# Patient Record
Sex: Male | Born: 1970 | Race: White | Hispanic: No | Marital: Married | Smoking: Never smoker
Health system: Southern US, Community
[De-identification: ages and names within clinical notes are randomized; demographics above are authoritative.]

## PROBLEM LIST (undated history)

## (undated) DIAGNOSIS — K219 Gastro-esophageal reflux disease without esophagitis: Secondary | ICD-10-CM

## (undated) DIAGNOSIS — E785 Hyperlipidemia, unspecified: Secondary | ICD-10-CM

## (undated) DIAGNOSIS — G4730 Sleep apnea, unspecified: Secondary | ICD-10-CM

## (undated) DIAGNOSIS — F419 Anxiety disorder, unspecified: Secondary | ICD-10-CM

## (undated) DIAGNOSIS — N289 Disorder of kidney and ureter, unspecified: Secondary | ICD-10-CM

## (undated) HISTORY — DX: Hyperlipidemia, unspecified: E78.5

## (undated) HISTORY — PX: OTHER SURGICAL HISTORY: SHX169

## (undated) HISTORY — DX: Sleep apnea, unspecified: G47.30

---

## 2003-11-21 ENCOUNTER — Encounter: Admission: RE | Admit: 2003-11-21 | Discharge: 2003-11-21 | Payer: Self-pay | Admitting: Family Medicine

## 2004-11-12 HISTORY — PX: ESOPHAGOGASTRODUODENOSCOPY: SHX1529

## 2005-06-03 ENCOUNTER — Ambulatory Visit: Payer: Self-pay | Admitting: Family Medicine

## 2005-07-15 ENCOUNTER — Ambulatory Visit: Payer: Self-pay | Admitting: Family Medicine

## 2006-01-29 ENCOUNTER — Ambulatory Visit: Payer: Self-pay | Admitting: Family Medicine

## 2006-10-18 ENCOUNTER — Ambulatory Visit: Payer: Self-pay | Admitting: Family Medicine

## 2006-12-29 ENCOUNTER — Ambulatory Visit: Payer: Self-pay | Admitting: Family Medicine

## 2006-12-29 LAB — CONVERTED CEMR LAB
ALT: 28 units/L (ref 0–40)
BUN: 13 mg/dL (ref 6–23)
CO2: 32 meq/L (ref 19–32)
Glucose, Bld: 100 mg/dL — ABNORMAL HIGH (ref 70–99)
Phosphorus: 4.5 mg/dL (ref 2.3–4.6)
Potassium: 4.2 meq/L (ref 3.5–5.1)
Total CHOL/HDL Ratio: 4.8
Triglycerides: 185 mg/dL — ABNORMAL HIGH (ref 0–149)

## 2007-02-18 ENCOUNTER — Ambulatory Visit (HOSPITAL_BASED_OUTPATIENT_CLINIC_OR_DEPARTMENT_OTHER): Admission: RE | Admit: 2007-02-18 | Discharge: 2007-02-18 | Payer: Self-pay | Admitting: Orthopedic Surgery

## 2007-03-30 ENCOUNTER — Ambulatory Visit: Payer: Self-pay | Admitting: Family Medicine

## 2007-04-01 LAB — CONVERTED CEMR LAB
HDL: 46.9 mg/dL (ref >39.0)
LDL Cholesterol: 69 mg/dL (ref 0–99)
Triglycerides: 181 mg/dL — ABNORMAL HIGH (ref 0–149)
VLDL: 36 mg/dL (ref 0–40)

## 2007-08-09 ENCOUNTER — Emergency Department (HOSPITAL_COMMUNITY): Admission: EM | Admit: 2007-08-09 | Discharge: 2007-08-09 | Payer: Self-pay | Admitting: Emergency Medicine

## 2007-12-14 ENCOUNTER — Ambulatory Visit: Payer: Self-pay | Admitting: Family Medicine

## 2007-12-20 ENCOUNTER — Telehealth (INDEPENDENT_AMBULATORY_CARE_PROVIDER_SITE_OTHER): Payer: Self-pay | Admitting: *Deleted

## 2007-12-21 ENCOUNTER — Ambulatory Visit: Payer: Self-pay | Admitting: Family Medicine

## 2007-12-27 LAB — CONVERTED CEMR LAB
ALT: 23 units/L (ref 0–53)
CO2: 31 meq/L (ref 19–32)
Chloride: 105 meq/L (ref 96–112)
Direct LDL: 91 mg/dL
GFR calc Af Amer: 110 mL/min
GFR calc non Af Amer: 91 mL/min
Sodium: 142 meq/L (ref 135–145)
Total CHOL/HDL Ratio: 4

## 2008-01-18 ENCOUNTER — Telehealth: Payer: Self-pay | Admitting: Family Medicine

## 2008-01-23 ENCOUNTER — Ambulatory Visit: Payer: Self-pay | Admitting: Family Medicine

## 2008-01-23 DIAGNOSIS — E785 Hyperlipidemia, unspecified: Secondary | ICD-10-CM | POA: Insufficient documentation

## 2008-01-23 DIAGNOSIS — G5600 Carpal tunnel syndrome, unspecified upper limb: Secondary | ICD-10-CM | POA: Insufficient documentation

## 2008-01-23 DIAGNOSIS — F411 Generalized anxiety disorder: Secondary | ICD-10-CM | POA: Insufficient documentation

## 2008-01-23 DIAGNOSIS — J309 Allergic rhinitis, unspecified: Secondary | ICD-10-CM | POA: Insufficient documentation

## 2008-01-23 DIAGNOSIS — K219 Gastro-esophageal reflux disease without esophagitis: Secondary | ICD-10-CM

## 2008-01-23 DIAGNOSIS — G4700 Insomnia, unspecified: Secondary | ICD-10-CM | POA: Insufficient documentation

## 2008-05-25 ENCOUNTER — Ambulatory Visit: Payer: Self-pay | Admitting: Family Medicine

## 2008-05-29 LAB — CONVERTED CEMR LAB
ALT: 36 U/L
AST: 23 U/L
Cholesterol: 193 mg/dL
HDL: 40.8 mg/dL
LDL Cholesterol: 115 mg/dL — ABNORMAL HIGH
Total CHOL/HDL Ratio: 4.7
Triglycerides: 184 mg/dL — ABNORMAL HIGH
VLDL: 37 mg/dL

## 2008-06-01 ENCOUNTER — Ambulatory Visit: Payer: Self-pay | Admitting: Family Medicine

## 2008-08-20 ENCOUNTER — Telehealth: Payer: Self-pay | Admitting: Family Medicine

## 2008-10-12 ENCOUNTER — Ambulatory Visit: Payer: Self-pay | Admitting: Family Medicine

## 2008-10-12 DIAGNOSIS — R0989 Other specified symptoms and signs involving the circulatory and respiratory systems: Secondary | ICD-10-CM

## 2008-10-12 DIAGNOSIS — R0609 Other forms of dyspnea: Secondary | ICD-10-CM | POA: Insufficient documentation

## 2008-10-12 DIAGNOSIS — R404 Transient alteration of awareness: Secondary | ICD-10-CM

## 2008-10-26 ENCOUNTER — Ambulatory Visit: Payer: Self-pay | Admitting: Pulmonary Disease

## 2008-10-26 DIAGNOSIS — G4733 Obstructive sleep apnea (adult) (pediatric): Secondary | ICD-10-CM

## 2008-11-12 ENCOUNTER — Encounter: Payer: Self-pay | Admitting: Pulmonary Disease

## 2008-11-12 ENCOUNTER — Ambulatory Visit (HOSPITAL_BASED_OUTPATIENT_CLINIC_OR_DEPARTMENT_OTHER): Admission: RE | Admit: 2008-11-12 | Discharge: 2008-11-12 | Payer: Self-pay | Admitting: Pulmonary Disease

## 2008-11-20 ENCOUNTER — Encounter (INDEPENDENT_AMBULATORY_CARE_PROVIDER_SITE_OTHER): Payer: Self-pay | Admitting: *Deleted

## 2008-11-24 ENCOUNTER — Ambulatory Visit: Payer: Self-pay | Admitting: Pulmonary Disease

## 2008-11-26 ENCOUNTER — Telehealth (INDEPENDENT_AMBULATORY_CARE_PROVIDER_SITE_OTHER): Payer: Self-pay | Admitting: *Deleted

## 2008-11-28 ENCOUNTER — Ambulatory Visit: Payer: Self-pay | Admitting: Family Medicine

## 2008-11-29 LAB — CONVERTED CEMR LAB
ALT: 39 units/L (ref 0–53)
Glucose, Bld: 93 mg/dL (ref 70–99)
HDL: 38.6 mg/dL — ABNORMAL LOW (ref >39.00)
Phosphorus: 4.8 mg/dL — ABNORMAL HIGH (ref 2.3–4.6)
Sodium: 141 meq/L (ref 135–145)
Triglycerides: 297 mg/dL — ABNORMAL HIGH (ref 0.0–149.0)

## 2008-12-05 ENCOUNTER — Ambulatory Visit: Payer: Self-pay | Admitting: Family Medicine

## 2008-12-11 ENCOUNTER — Ambulatory Visit: Payer: Self-pay | Admitting: Pulmonary Disease

## 2008-12-20 ENCOUNTER — Telehealth (INDEPENDENT_AMBULATORY_CARE_PROVIDER_SITE_OTHER): Payer: Self-pay | Admitting: *Deleted

## 2009-01-07 ENCOUNTER — Ambulatory Visit: Payer: Self-pay | Admitting: Pulmonary Disease

## 2009-01-08 ENCOUNTER — Telehealth: Payer: Self-pay | Admitting: Family Medicine

## 2009-01-22 ENCOUNTER — Telehealth: Payer: Self-pay | Admitting: Family Medicine

## 2009-02-19 ENCOUNTER — Encounter: Payer: Self-pay | Admitting: Pulmonary Disease

## 2009-03-06 ENCOUNTER — Ambulatory Visit: Payer: Self-pay | Admitting: Family Medicine

## 2009-03-08 ENCOUNTER — Encounter: Payer: Self-pay | Admitting: Pulmonary Disease

## 2009-03-08 ENCOUNTER — Ambulatory Visit: Payer: Self-pay | Admitting: Family Medicine

## 2009-03-11 LAB — CONVERTED CEMR LAB
AST: 30 units/L (ref 0–37)
Cholesterol: 190 mg/dL (ref 0–200)
Direct LDL: 107 mg/dL
Total CHOL/HDL Ratio: 5

## 2009-05-10 ENCOUNTER — Ambulatory Visit: Payer: Self-pay | Admitting: Family Medicine

## 2009-05-10 DIAGNOSIS — M549 Dorsalgia, unspecified: Secondary | ICD-10-CM | POA: Insufficient documentation

## 2009-06-05 ENCOUNTER — Ambulatory Visit: Payer: Self-pay | Admitting: Family Medicine

## 2009-06-13 ENCOUNTER — Ambulatory Visit: Payer: Self-pay | Admitting: Professional

## 2009-07-09 ENCOUNTER — Ambulatory Visit: Payer: Self-pay | Admitting: Family Medicine

## 2009-08-01 ENCOUNTER — Telehealth: Payer: Self-pay | Admitting: Family Medicine

## 2009-09-10 ENCOUNTER — Telehealth: Payer: Self-pay | Admitting: Family Medicine

## 2009-10-01 ENCOUNTER — Ambulatory Visit: Payer: Self-pay | Admitting: Family Medicine

## 2009-10-09 ENCOUNTER — Telehealth (INDEPENDENT_AMBULATORY_CARE_PROVIDER_SITE_OTHER): Payer: Self-pay | Admitting: *Deleted

## 2010-10-14 NOTE — Progress Notes (Signed)
Summary: Record request  Request for records received from [facility]. Request forwarded to [facility]. Ryan Welch  October 09, 2009 3:08 PM

## 2011-01-27 NOTE — Op Note (Signed)
NAMEARYON, NHAM              ACCOUNT NO.:  [id number]   MEDICAL RECORD NO.:  [id number]          PATIENT TYPE:  AMB   LOCATION:  DSC                          FACILITY:  [facility]   PHYSICIAN:  Cindee Salt, M.D.       DATE OF BIRTH:  03/31/1971   DATE OF PROCEDURE:  02/18/2007  DATE OF DISCHARGE:                               OPERATIVE REPORT   PREOPERATIVE DIAGNOSIS:  Carpal tunnel syndrome left hand.   POSTOPERATIVE DIAGNOSIS:  Carpal tunnel syndrome left hand.   OPERATION:  Decompression left median nerve.   SURGEON:  Cindee Salt, M.D.   ASSISTANT:  Carolyne Fiscal R.N.   ANESTHESIA:  Forearm based IV regional.   HISTORY:  The patient is a 40 year old male with a history of carpal  tunnel syndrome, EMG nerve conductions positive.  This has not responded  to conservative treatment.  He has elected to undergo decompression.  He  is aware of risks and complications including infection, recurrence,  injury to arteries, nerves, tendons, incomplete relief of symptoms,  dystrophy. Questions have been encouraged and answered. In the  preoperative area he is seen, questions again encouraged and answered,  the extremity marked by both the patient and surgeon.  Antibiotic given.   PROCEDURE:  The patient is brought to the operating room where a forearm  based IV regional anesthetic was carried out without difficulty.  He was  prepped using DuraPrep, supine position, left arm free.  A longitudinal  incision was made in the palm and carried down through subcutaneous  tissue.  Bleeders were electrocauterized.  The palmar fascia was split,  superficial palmar arch identified, flexor tendon to the ring and little  finger identified. To the ulnar side of median nerve, the carpal  retinaculum was incised with sharp dissection, right angle and Sewall  retractor were placed between the skin and forearm fascia.  The fascia  released for approximately 1.5 cm proximal to the wrist crease under  direct  vision. The canal was explored.  Tenosynovium tissue was  moderately thickened, the area of deformity to the nerve was apparent.  No further lesions were identified.  The wound was irrigated.  The skin  was then closed interrupted 5-0 Vicryl Rapide sutures.  A sterile  compressive dressing and splint was applied.  The patient tolerated the  procedure well and was taken to the recovery room for observation in  satisfactory condition.  He is discharged home to return to the [facility] in one week on Vicodin.           ______________________________  Cindee Salt, M.D.     GK/MEDQ  D:  02/18/2007  T:  02/18/2007  Job:  [id number]

## 2011-01-27 NOTE — Procedures (Signed)
Ryan Welch, NEUMAN              ACCOUNT NO.:  [id number]   MEDICAL RECORD NO.:  [id number]          PATIENT TYPE:  OUT   LOCATION:  SLEEP CENTER                 FACILITY:  [facility]   PHYSICIAN:  Barbaraann Share, MD,FCCPDATE OF BIRTH:  02/03/73   DATE OF STUDY:  11/12/2008                            NOCTURNAL POLYSOMNOGRAM   REFERRING PHYSICIAN:  Barbaraann Share, MD,FCCP   INDICATION FOR THE STUDY:  Hypersomnia with sleep apnea.   EPWORTH SCORE:  17.   SLEEP ARCHITECTURE:  The patient had a total sleep time of 309 minutes,  with very little slow wave sleep and never achieved REM.  Sleep onset  latency was mildly prolonged at 31 minutes, and sleep efficiency was  moderately reduced at 80%.   RESPIRATORY DATA:  The patient was found to have 19 apneas and 48  hypopneas, giving him an apnea/hypopnea index of 13 events per hour.  The events were not positional and there was moderate snoring noted  throughout.   OXYGEN DATA:  There was O2 desaturation as low as 86% with his  obstructive events.   CARDIAC DATA:  Rare PAC, but no clinically significant arrhythmias were  seen.   MOVEMENT/PARASOMNIA:  The patient had no significant leg jerks or  abnormal behavior seen.   IMPRESSION/RECOMMENDATIONS:  1. Mild obstructive sleep apnea/hypopnea syndrome with an AHI of 13      events per hour, and moderate snoring noted throughout.  There was      O2 desaturation as low as 86% transiently with his events.      Treatment for this degree of sleep apnea can include a trial of      weight loss alone if applicable, upper airway surgery, dental      appliance, and also continuous positive airway pressure.  Clinical      correlation is suggested.  2. Rare premature atrial contraction, but no clinically significant      arrhythmias seen.      Barbaraann Share, MD,FCCP  Diplomate, American Board of Sleep  Medicine  Electronically Signed    [facility]/MEDQ  D:  11/24/2008 14:22:12  T:   11/25/2008 01:59:18  Job:  [id number]

## 2011-07-02 LAB — POCT HEMOGLOBIN-HEMACUE: Operator id: 128471

## 2011-10-16 ENCOUNTER — Other Ambulatory Visit: Payer: Self-pay | Admitting: Orthopedic Surgery

## 2011-10-19 ENCOUNTER — Encounter (HOSPITAL_BASED_OUTPATIENT_CLINIC_OR_DEPARTMENT_OTHER): Payer: Self-pay | Admitting: *Deleted

## 2011-10-19 NOTE — Progress Notes (Signed)
Requested sleep study from [facility] Tomi Bamberger) -- pt states he does not have sleep apnea but does  have a CPAP machine that he does not use.

## 2011-10-21 ENCOUNTER — Encounter (HOSPITAL_BASED_OUTPATIENT_CLINIC_OR_DEPARTMENT_OTHER): Payer: Self-pay | Admitting: Anesthesiology

## 2011-10-21 ENCOUNTER — Ambulatory Visit (HOSPITAL_BASED_OUTPATIENT_CLINIC_OR_DEPARTMENT_OTHER)
Admission: RE | Admit: 2011-10-21 | Discharge: 2011-10-21 | Disposition: A | Discharge status: Home / Self Care | Payer: 59 | Source: Ambulatory Visit | Attending: Orthopedic Surgery | Admitting: Orthopedic Surgery

## 2011-10-21 ENCOUNTER — Encounter (HOSPITAL_BASED_OUTPATIENT_CLINIC_OR_DEPARTMENT_OTHER): Payer: Self-pay | Admitting: *Deleted

## 2011-10-21 ENCOUNTER — Encounter (HOSPITAL_BASED_OUTPATIENT_CLINIC_OR_DEPARTMENT_OTHER)
Admission: RE | Disposition: A | Discharge status: Home / Self Care | Payer: Self-pay | Source: Ambulatory Visit | Attending: Orthopedic Surgery

## 2011-10-21 ENCOUNTER — Ambulatory Visit (HOSPITAL_BASED_OUTPATIENT_CLINIC_OR_DEPARTMENT_OTHER): Payer: 59 | Admitting: Anesthesiology

## 2011-10-21 DIAGNOSIS — G5600 Carpal tunnel syndrome, unspecified upper limb: Secondary | ICD-10-CM | POA: Insufficient documentation

## 2011-10-21 DIAGNOSIS — K219 Gastro-esophageal reflux disease without esophagitis: Secondary | ICD-10-CM | POA: Insufficient documentation

## 2011-10-21 DIAGNOSIS — Z8249 Family history of ischemic heart disease and other diseases of the circulatory system: Secondary | ICD-10-CM | POA: Insufficient documentation

## 2011-10-21 HISTORY — DX: Anxiety disorder, unspecified: F41.9

## 2011-10-21 HISTORY — DX: Gastro-esophageal reflux disease without esophagitis: K21.9

## 2011-10-21 HISTORY — PX: CARPAL TUNNEL RELEASE: SHX101

## 2011-10-21 LAB — POCT HEMOGLOBIN-HEMACUE: Hemoglobin: 15.4 g/dL (ref 13.0–17.0)

## 2011-10-21 SURGERY — CARPAL TUNNEL RELEASE
Anesthesia: Monitor Anesthesia Care | Site: Wrist | Laterality: Right | Wound class: Clean

## 2011-10-21 MED ORDER — BUPIVACAINE HCL (PF) 0.25 % IJ SOLN
INTRAMUSCULAR | Status: DC | PRN
  Administered 2011-10-21: 6 mL

## 2011-10-21 MED ORDER — MIDAZOLAM HCL 5 MG/5ML IJ SOLN
INTRAMUSCULAR | Status: DC | PRN
  Administered 2011-10-21: 2 mg via INTRAVENOUS

## 2011-10-21 MED ORDER — FENTANYL CITRATE 0.05 MG/ML IJ SOLN
INTRAMUSCULAR | Status: DC | PRN
  Administered 2011-10-21: 100 ug via INTRAVENOUS

## 2011-10-21 MED ORDER — PROPOFOL 10 MG/ML IV EMUL
INTRAVENOUS | Status: DC | PRN
  Administered 2011-10-21: 100 ug/kg/min via INTRAVENOUS

## 2011-10-21 MED ORDER — DEXAMETHASONE SODIUM PHOSPHATE 10 MG/ML IJ SOLN
INTRAMUSCULAR | Status: DC | PRN
Start: 2011-10-21 — End: 2011-10-21
  Administered 2011-10-21: 10 mg via INTRAVENOUS

## 2011-10-21 MED ORDER — LIDOCAINE HCL (CARDIAC) 20 MG/ML IV SOLN
INTRAVENOUS | Status: DC | PRN
  Administered 2011-10-21: 40 mg via INTRAVENOUS

## 2011-10-21 MED ORDER — ONDANSETRON HCL 4 MG/2ML IJ SOLN
INTRAMUSCULAR | Status: DC | PRN
  Administered 2011-10-21: 4 mg via INTRAVENOUS

## 2011-10-21 MED ORDER — CEFAZOLIN SODIUM-DEXTROSE 2-3 GM-% IV SOLR
2.0000 g | INTRAVENOUS | Status: AC
  Administered 2011-10-21: 2 g via INTRAVENOUS

## 2011-10-21 MED ORDER — CHLORHEXIDINE GLUCONATE 4 % EX LIQD: 60.0000 mL | Freq: Once | CUTANEOUS | Status: DC

## 2011-10-21 MED ORDER — LACTATED RINGERS IV SOLN
INTRAVENOUS | Status: DC
  Administered 2011-10-21: 09:00:00 via INTRAVENOUS

## 2011-10-21 MED ORDER — HYDROCODONE-ACETAMINOPHEN 5-500 MG PO TABS: 1.0000 | ORAL_TABLET | ORAL | Status: AC | PRN

## 2011-10-21 SURGICAL SUPPLY — 34 items
BANDAGE GAUZE ELAST BULKY 4 IN (GAUZE/BANDAGES/DRESSINGS) ×2 IMPLANT
BLADE SURG 15 STRL LF DISP TIS (BLADE) ×1 IMPLANT
BLADE SURG 15 STRL SS (BLADE) ×1
BNDG COHESIVE 3X5 TAN STRL LF (GAUZE/BANDAGES/DRESSINGS) ×2 IMPLANT
BNDG ESMARK 4X9 LF (GAUZE/BANDAGES/DRESSINGS) IMPLANT
CHLORAPREP W/TINT 26ML (MISCELLANEOUS) ×2 IMPLANT
CLOTH BEACON ORANGE TIMEOUT ST (SAFETY) ×2 IMPLANT
CORDS BIPOLAR (ELECTRODE) ×2 IMPLANT
COVER MAYO STAND STRL (DRAPES) ×2 IMPLANT
COVER TABLE BACK 60X90 (DRAPES) ×2 IMPLANT
CUFF TOURNIQUET SINGLE 18IN (TOURNIQUET CUFF) ×2 IMPLANT
DRAPE EXTREMITY T 121X128X90 (DRAPE) ×2 IMPLANT
DRAPE SURG 17X23 STRL (DRAPES) ×2 IMPLANT
DRSG KUZMA FLUFF (GAUZE/BANDAGES/DRESSINGS) ×2 IMPLANT
GAUZE XEROFORM 1X8 LF (GAUZE/BANDAGES/DRESSINGS) ×2 IMPLANT
GLOVE BIO SURGEON STRL SZ 6.5 (GLOVE) ×4 IMPLANT
GLOVE SURG ORTHO 8.0 STRL STRW (GLOVE) ×4 IMPLANT
GOWN BRE IMP PREV XXLGXLNG (GOWN DISPOSABLE) ×4 IMPLANT
GOWN PREVENTION PLUS XLARGE (GOWN DISPOSABLE) ×2 IMPLANT
NEEDLE 27GAX1X1/2 (NEEDLE) ×2 IMPLANT
NS IRRIG 1000ML POUR BTL (IV SOLUTION) ×2 IMPLANT
PACK BASIN DAY SURGERY FS (CUSTOM PROCEDURE TRAY) ×2 IMPLANT
PAD CAST 3X4 CTTN HI CHSV (CAST SUPPLIES) ×1 IMPLANT
PADDING CAST ABS 4INX4YD NS (CAST SUPPLIES) ×1
PADDING CAST ABS COTTON 4X4 ST (CAST SUPPLIES) ×1 IMPLANT
PADDING CAST COTTON 3X4 STRL (CAST SUPPLIES) ×1
SPONGE GAUZE 4X4 12PLY (GAUZE/BANDAGES/DRESSINGS) ×2 IMPLANT
STOCKINETTE 4X48 STRL (DRAPES) ×2 IMPLANT
SUT VICRYL 4-0 PS2 18IN ABS (SUTURE) IMPLANT
SUT VICRYL RAPIDE 4/0 PS 2 (SUTURE) ×2 IMPLANT
SYR BULB 3OZ (MISCELLANEOUS) ×2 IMPLANT
SYR CONTROL 10ML LL (SYRINGE) ×2 IMPLANT
TOWEL OR 17X24 6PK STRL BLUE (TOWEL DISPOSABLE) ×2 IMPLANT
UNDERPAD 30X30 INCONTINENT (UNDERPADS AND DIAPERS) ×2 IMPLANT

## 2011-10-21 NOTE — Anesthesia Preprocedure Evaluation (Signed)
Anesthesia Evaluation  Patient identified by MRN, date of birth, ID band Patient awake    Reviewed: Allergy & Precautions, H&P , NPO status , Patient's Chart, lab work & pertinent test results  Airway Mallampati: I TM Distance: >3 FB Neck ROM: Full    Dental No notable dental hx. (+) Teeth Intact   Pulmonary neg pulmonary ROS,  clear to auscultation  Pulmonary exam normal       Cardiovascular neg cardio ROS Regular Normal    Neuro/Psych  Neuromuscular disease Negative Neurological ROS  Negative Psych ROS   GI/Hepatic Neg liver ROS, GERD-  Medicated and Controlled,  Endo/Other  Negative Endocrine ROS  Renal/GU negative Renal ROS  Genitourinary negative   Musculoskeletal   Abdominal   Peds  Hematology negative hematology ROS (+)   Anesthesia Other Findings   Reproductive/Obstetrics negative OB ROS                           Anesthesia Physical Anesthesia Plan  ASA: II  Anesthesia Plan: MAC and Bier Block   Post-op Pain Management:    Induction: Intravenous  Airway Management Planned: Simple Face Mask  Additional Equipment:   Intra-op Plan:   Post-operative Plan:   Informed Consent: I have reviewed the patients History and Physical, chart, labs and discussed the procedure including the risks, benefits and alternatives for the proposed anesthesia with the patient or authorized representative who has indicated his/her understanding and acceptance.     Plan Discussed with: CRNA  Anesthesia Plan Comments:         Anesthesia Quick Evaluation

## 2011-10-21 NOTE — Op Note (Signed)
Dictated number: [id number]

## 2011-10-21 NOTE — Brief Op Note (Signed)
10/21/2011  9:55 AM  PATIENT:  Genevieve Norlander  41 y.o. male  PRE-OPERATIVE DIAGNOSIS:  right carpal tunnel syndrome  POST-OPERATIVE DIAGNOSIS:  same as preop  PROCEDURE:  Procedure(s): CARPAL TUNNEL RELEASE  SURGEON:  Surgeon(s): Nicki Reaper, MD  PHYSICIAN ASSISTANT:   ASSISTANTS: none   ANESTHESIA:   regional local  EBL:  Total I/O In: 500 [I.V.:500] Out: -   BLOOD ADMINISTERED:none  DRAINS: none   LOCAL MEDICATIONS USED:  MARCAINE 6CC  SPECIMEN:  No Specimen  DISPOSITION OF SPECIMEN:  N/A  COUNTS:  YES  TOURNIQUET:   Total Tourniquet Time Documented: Forearm (Right) - 18 minutes  DICTATION: .Other Dictation: Dictation Number [id number]  PLAN OF CARE: Discharge to home after PACU  PATIENT DISPOSITION:  PACU - hemodynamically stable.

## 2011-10-21 NOTE — Op Note (Signed)
[name], PARKE NO.:  [id number]  MEDICAL RECORD NO.:  [id number]  LOCATION:                                 FACILITY:  PHYSICIAN:  Cindee Salt, M.D.            DATE OF BIRTH:  DATE OF PROCEDURE:  10/21/2011 DATE OF DISCHARGE:                              OPERATIVE REPORT   PREOPERATIVE DIAGNOSE:  Carpal tunnel syndrome, right hand.  POSTOPERATIVE DIAGNOSIS:  Carpal tunnel syndrome, right hand.  OPERATION:  Decompression of right median nerve.  SURGEON:  Cindee Salt, M.D.  ANESTHESIA:  Forearm-based IV regional with local infiltration.  ANESTHESIOLOGIST:  Zenon Mayo, MD.  HISTORY:  The patient is a 41 year old male with a history of carpal tunnel syndrome, EMG nerve conductions positive.  This has not responded to conservative treatment.  He is desirous of proceeding to have this surgically released.  Pre, peri, postoperative course has been discussed along with risks and complications.  He is aware there is no guarantee with the surgery, possibility of infection, recurrence, injury to arteries, nerves, tendons, incomplete relief of symptoms, dystrophy.  In the preoperative area, the patient is seen, the extremity marked by both the patient and surgeon.  Antibiotic given.  PROCEDURE:  The patient was brought to the operating room where a forearm-based IV regional anesthetic was carried out without difficulty. He was prepped using ChloraPrep, supine position with the right arm free.  A 3 minutes dry time was allowed.  Time-out taken, confirming the patient and the procedure.  A longitudinal incision was made in the palm, carried down through subcutaneous tissue.  Bleeders were electrocauterized.  Palmar fascia was split.  Superficial palmar arch identified.  The flexor tendon of the ring and little finger identified to the ulnar side of the median nerve.  The carpal retinaculum was incised with sharp dissection.  Right angle and Sewall  retractor were placed between skin and forearm fascia.  The fascia released for approximately a centimeter and half proximal to the wrist crease under direct vision.  The canal was explored.  Air compression to the nerve was apparent.  No further lesions were identified.  The wound was irrigated.  Skin closed with interrupted 4-0 Vicryl Rapide sutures.  Local infiltration was given with 0.25% Marcaine without epinephrine 6 mL was used.  Sterile compressive dressing was applied with the fingers free.  On deflation of the tourniquet, all fingers immediately pinked.  He was taken to the recovery room for observation in satisfactory condition.          ______________________________ Cindee Salt, M.D.     GK/MEDQ  D:  10/21/2011  T:  10/21/2011  Job:  [id number]

## 2011-10-21 NOTE — Anesthesia Postprocedure Evaluation (Signed)
  Anesthesia Post-op Note  Patient: Ryan Welch  Procedure(s) Performed:  CARPAL TUNNEL RELEASE  Patient Location: PACU  Anesthesia Type: Bier block  Level of Consciousness: awake, alert  and oriented  Airway and Oxygen Therapy: Patient Spontanous Breathing and Patient connected to face mask oxygen  Post-op Pain: none  Post-op Assessment: Post-op Vital signs reviewed, Patient's Cardiovascular Status Stable, Respiratory Function Stable, Patent Airway and No signs of Nausea or vomiting  Post-op Vital Signs: Reviewed and stable  Complications: No apparent anesthesia complications

## 2011-10-21 NOTE — H&P (Signed)
Ryan Welch is complaining of pain, numbness and tingling of his right hand, median nerve distribution.  He states it is identical to his left hand prior to his carpal tunnel release.  He states the numbness and tingling is there on a constant basis, he has no new history of injury to the hand or to the neck.  He complains of a constant, mild to moderate burning type pain with a feeling of numbness to his fingers.  He states it is getting worse.  Activity, exercise and work make this worse. Rest makes it better.  He has been taking Aleve.  He has no history of diabetes, thyroid problems, arthritis or gout.  He is status post carpal tunnel release done five years ago on his left side.  Nerve conductions were positive at that point in time.    ALLERGIES:    None.  MEDICATIONS:    Crestor and Lexapro.  SURGICAL HISTORY:     Carpal tunnel release on his left hand, he states this is doing well.  FAMILY MEDICAL HISTORY:   Positive for heart disease otherwise negative.  SOCIAL HISTORY:     He does not smoke, he drinks socially.  He is married. 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  REVIEW OF SYSTEMS:     Positive for anxiety, otherwise negative 14 points.  Ryan Welch is an 41 y.o. male.   Chief Complaint: CTS rt HPI: see above  Past Medical History  Diagnosis Date  . Anxiety   . GERD (gastroesophageal reflux disease)     Past Surgical History  Procedure Date  . Deviated septum and polyps removed   . Left hand carpal tunnel surgery     History reviewed. No pertinent family history. Social History:  does not have a smoking history on file. He does not have any smokeless tobacco history on file. He reports that he does not drink alcohol or use illicit drugs.  Allergies:  Allergies  Allergen Reactions  . Atorvastatin     REACTION: myalgias  . Sertraline Hcl     REACTION: mood changes    Medications Prior to Admission  Medication Dose Route Frequency Provider Last Rate  Last Dose  . lactated ringers infusion   Intravenous Continuous Constance Goltz, MD 10 mL/hr at 10/21/11 0831     Medications Prior to Admission  Medication Sig Dispense Refill  . clonazePAM (KLONOPIN) 1 MG disintegrating tablet Take 1 mg by mouth as needed.       Marland Kitchen escitalopram (LEXAPRO) 20 MG tablet Take 20 mg by mouth daily.      Marland Kitchen ibuprofen (ADVIL,MOTRIN) 200 MG tablet Take 200 mg by mouth as needed.      Marland Kitchen omeprazole (PRILOSEC) 10 MG capsule Take 10 mg by mouth daily.      . rosuvastatin (CRESTOR) 5 MG tablet Take 5 mg by mouth daily.        Results for orders placed during the hospital encounter of 10/21/11 (from the past 48 hour(s))  POCT HEMOGLOBIN-HEMACUE     Status: Normal   Collection Time   10/21/11  8:34 AM      Component Value Range Comment   Hemoglobin 15.4  13.0 - 17.0 (g/dL)     No results found.   Pertinent items are noted in HPI.  Blood pressure 137/78, pulse 75, temperature 97.8 F (36.6 C), temperature source Oral, resp. rate 20, height 5\' 8"  (1.727 m), weight 88.451 kg (195 lb), SpO2 96.00%.  General  appearance: alert, cooperative and appears stated age Head: Normocephalic, without obvious abnormality Neck: no adenopathy Resp: clear to auscultation bilaterally Cardio: regular rate and rhythm, S1, S2 normal, no murmur, click, rub or gallop GI: soft, non-tender; bowel sounds normal; no masses,  no organomegaly Extremities: extremities normal, atraumatic, no cyanosis or edema Pulses: 2+ and symmetric Skin: Skin color, texture, turgor normal. No rashes or lesions Neurologic: Grossly normal Incision/Wound: na  Assessment/Plan He would like to go ahead and have this surgically released.  He is well aware of risks and complications including infection, recurrence, injury to arteries, nerves, tendons, incomplete relief of symptoms, dystrophy, having had his left side done.    Ryan Welch R 10/21/2011, 8:38 AM

## 2011-10-21 NOTE — Anesthesia Procedure Notes (Signed)
Procedure Name: MAC Date/Time: 10/21/2011 9:33 AM Performed by: Jearld Shines Pre-anesthesia Checklist: Patient identified, Emergency Drugs available, Suction available and Patient being monitored Patient Re-evaluated:Patient Re-evaluated prior to inductionOxygen Delivery Method: Simple face mask Preoxygenation: Pre-oxygenation with 100% oxygen Intubation Type: IV induction Placement Confirmation: positive ETCO2

## 2011-10-21 NOTE — Transfer of Care (Signed)
Immediate Anesthesia Transfer of Care Note  Patient: Ryan Welch  Procedure(s) Performed:  CARPAL TUNNEL RELEASE  Patient Location: PACU  Anesthesia Type: Bier block  Level of Consciousness: sedated  Airway & Oxygen Therapy: Patient Spontanous Breathing and Patient connected to face mask oxygen  Post-op Assessment: Report given to PACU RN and Post -op Vital signs reviewed and stable  Post vital signs: Reviewed and stable  Complications: No apparent anesthesia complications

## 2011-10-22 ENCOUNTER — Encounter (HOSPITAL_BASED_OUTPATIENT_CLINIC_OR_DEPARTMENT_OTHER): Payer: Self-pay | Admitting: Orthopedic Surgery

## 2011-10-30 ENCOUNTER — Telehealth: Payer: Self-pay | Admitting: Pulmonary Disease

## 2011-10-30 NOTE — Telephone Encounter (Signed)
I spoke with pt and he stated his wife had faxed [place] a form regarding his oral appliance. He stated he just spoke with someone from our office. I spoke with alida, libby, rhonda, and megan and they all stated they have not spoken with pt. I called pt back to advised him of this and he stated he will just have his wife to call on Monday bc she knows more about this. Will sign off message

## 2011-11-02 ENCOUNTER — Ambulatory Visit: Payer: 59 | Admitting: Pulmonary Disease

## 2014-01-08 ENCOUNTER — Emergency Department (HOSPITAL_COMMUNITY): Payer: 59

## 2014-01-08 ENCOUNTER — Encounter (HOSPITAL_COMMUNITY): Payer: Self-pay | Admitting: Emergency Medicine

## 2014-01-08 ENCOUNTER — Emergency Department (HOSPITAL_COMMUNITY)
Admission: EM | Admit: 2014-01-08 | Discharge: 2014-01-08 | Disposition: A | Discharge status: Home / Self Care | Payer: 59 | Attending: Emergency Medicine | Admitting: Emergency Medicine

## 2014-01-08 DIAGNOSIS — K219 Gastro-esophageal reflux disease without esophagitis: Secondary | ICD-10-CM | POA: Insufficient documentation

## 2014-01-08 DIAGNOSIS — N200 Calculus of kidney: Secondary | ICD-10-CM | POA: Insufficient documentation

## 2014-01-08 DIAGNOSIS — E785 Hyperlipidemia, unspecified: Secondary | ICD-10-CM | POA: Insufficient documentation

## 2014-01-08 DIAGNOSIS — Z79899 Other long term (current) drug therapy: Secondary | ICD-10-CM | POA: Insufficient documentation

## 2014-01-08 DIAGNOSIS — F411 Generalized anxiety disorder: Secondary | ICD-10-CM | POA: Insufficient documentation

## 2014-01-08 HISTORY — DX: Disorder of kidney and ureter, unspecified: N28.9

## 2014-01-08 LAB — CBC WITH DIFFERENTIAL/PLATELET
BASOS ABS: 0 10*3/uL (ref 0.0–0.1)
BASOS PCT: 0 % (ref 0–1)
EOS ABS: 0.1 10*3/uL (ref 0.0–0.7)
EOS PCT: 1 % (ref 0–5)
HCT: 44.2 % (ref 39.0–52.0)
Hemoglobin: 15.3 g/dL (ref 13.0–17.0)
Lymphocytes Relative: 34 % (ref 12–46)
Lymphs Abs: 2.2 10*3/uL (ref 0.7–4.0)
MCH: 28.7 pg (ref 26.0–34.0)
MCHC: 34.6 g/dL (ref 30.0–36.0)
MCV: 82.8 fL (ref 78.0–100.0)
Monocytes Absolute: 0.6 10*3/uL (ref 0.1–1.0)
Monocytes Relative: 9 % (ref 3–12)
NEUTROS PCT: 55 % (ref 43–77)
Neutro Abs: 3.6 10*3/uL (ref 1.7–7.7)
PLATELETS: 279 10*3/uL (ref 150–400)
RBC: 5.34 MIL/uL (ref 4.22–5.81)
RDW: 12.5 % (ref 11.5–15.5)
WBC: 6.4 10*3/uL (ref 4.0–10.5)

## 2014-01-08 LAB — COMPREHENSIVE METABOLIC PANEL
ALBUMIN: 4.5 g/dL (ref 3.5–5.2)
ALT: 26 U/L (ref 0–53)
AST: 26 U/L (ref 0–37)
Alkaline Phosphatase: 59 U/L (ref 39–117)
BUN: 17 mg/dL (ref 6–23)
CALCIUM: 9.7 mg/dL (ref 8.4–10.5)
CO2: 26 mEq/L (ref 19–32)
Chloride: 97 mEq/L (ref 96–112)
Creatinine, Ser: 1.13 mg/dL (ref 0.50–1.35)
GFR calc non Af Amer: 80 mL/min — ABNORMAL LOW (ref >90)
Glucose, Bld: 106 mg/dL — ABNORMAL HIGH (ref 70–99)
Potassium: 4.1 mEq/L (ref 3.7–5.3)
SODIUM: 139 meq/L (ref 137–147)
TOTAL PROTEIN: 7.6 g/dL (ref 6.0–8.3)
Total Bilirubin: 0.4 mg/dL (ref 0.3–1.2)

## 2014-01-08 LAB — URINALYSIS, ROUTINE W REFLEX MICROSCOPIC
Bilirubin Urine: NEGATIVE
Glucose, UA: NEGATIVE mg/dL
HGB URINE DIPSTICK: NEGATIVE
Ketones, ur: NEGATIVE mg/dL
Leukocytes, UA: NEGATIVE
Nitrite: NEGATIVE
PH: 5.5 (ref 5.0–8.0)
Protein, ur: NEGATIVE mg/dL
SPECIFIC GRAVITY, URINE: 1.029 (ref 1.005–1.030)
UROBILINOGEN UA: 0.2 mg/dL (ref 0.0–1.0)

## 2014-01-08 LAB — LIPASE, BLOOD: Lipase: 39 U/L (ref 11–59)

## 2014-01-08 MED ORDER — SODIUM CHLORIDE 0.9 % IV BOLUS (SEPSIS)
1000.0000 mL | Freq: Once | INTRAVENOUS | Status: AC
  Administered 2014-01-08: 1000 mL via INTRAVENOUS

## 2014-01-08 MED ORDER — MORPHINE SULFATE 4 MG/ML IJ SOLN
4.0000 mg | Freq: Once | INTRAMUSCULAR | Status: AC
Start: 2014-01-08 — End: 2014-01-08
  Administered 2014-01-08: 4 mg via INTRAVENOUS
  Filled 2014-01-08: qty 1

## 2014-01-08 MED ORDER — ONDANSETRON 4 MG PO TBDP: 4.0000 mg | ORAL_TABLET | Freq: Three times a day (TID) | ORAL | Status: AC | PRN

## 2014-01-08 MED ORDER — HYDROCODONE-ACETAMINOPHEN 5-325 MG PO TABS
2.0000 | ORAL_TABLET | ORAL | Status: AC | PRN
Start: 2014-01-08 — End: ?

## 2014-01-08 MED ORDER — IOHEXOL 300 MG/ML  SOLN
50.0000 mL | Freq: Once | INTRAMUSCULAR | Status: AC | PRN
  Administered 2014-01-08: 50 mL via ORAL

## 2014-01-08 MED ORDER — KETOROLAC TROMETHAMINE 30 MG/ML IJ SOLN
30.0000 mg | Freq: Once | INTRAMUSCULAR | Status: AC
  Administered 2014-01-08: 30 mg via INTRAVENOUS
  Filled 2014-01-08: qty 1

## 2014-01-08 MED ORDER — IOHEXOL 300 MG/ML  SOLN
100.0000 mL | Freq: Once | INTRAMUSCULAR | Status: AC | PRN
  Administered 2014-01-08: 100 mL via INTRAVENOUS

## 2014-01-08 NOTE — ED Notes (Signed)
MD at bedside. 

## 2014-01-08 NOTE — ED Provider Notes (Signed)
CSN: [id number]     Arrival date & time 01/08/14  [id number] History   First MD Initiated Contact with Patient 01/08/14 [phone]     Chief Complaint  Patient presents with  . Flank Pain     (Consider location/radiation/quality/duration/timing/severity/associated sxs/prior Treatment) HPI Comments: Patient is a 43 year old male with a past medical history of kidney stones who presents with abdominal pain that started this morning. The pain is located in his RLQ and does not radiate. The pain is described as sharp and severe. The pain started suddenly and progressively worsened since the onset. No alleviating/aggravating factors. The patient has tried nothing for symptoms without relief. Associated symptoms include nausea and urinary frequency. Patient denies fever, headache, vomiting, diarrhea, chest pain, SOB, dysuria, constipation.   Patient is a 43 y.o. male presenting with flank pain.  Flank Pain Associated symptoms include abdominal pain and nausea. Pertinent negatives include no arthralgias, chest pain, chills, fatigue, fever, neck pain, vomiting or weakness.    Past Medical History  Diagnosis Date  . Anxiety   . GERD (gastroesophageal reflux disease)   . Allergic rhinitis   . Hyperlipidemia   . Sleep apnea   . Renal disorder    Past Surgical History  Procedure Laterality Date  . Deviated septum and polyps removed    . Left hand carpal tunnel surgery    . Carpal tunnel release  10/21/2011    Procedure: CARPAL TUNNEL RELEASE;  Surgeon: Nicki ReaperGary R Kuzma, MD;  Location: [facility];  Service: Orthopedics;  Laterality: Right;  . Sleep study      2010  . Esophagogastroduodenoscopy  11/2004    gastritis   Family History  Problem Relation Age of Onset  . GER disease Father   . Coronary artery disease Father    History  Substance Use Topics  . Smoking status: Never Smoker   . Smokeless tobacco: Not on file  . Alcohol Use: No    Review of Systems  Constitutional: Negative  for fever, chills and fatigue.  HENT: Negative for trouble swallowing.   Eyes: Negative for visual disturbance.  Respiratory: Negative for shortness of breath.   Cardiovascular: Negative for chest pain and palpitations.  Gastrointestinal: Positive for nausea and abdominal pain. Negative for vomiting and diarrhea.  Genitourinary: Positive for flank pain. Negative for dysuria and difficulty urinating.  Musculoskeletal: Negative for arthralgias and neck pain.  Skin: Negative for color change.  Neurological: Negative for dizziness and weakness.  Psychiatric/Behavioral: Negative for dysphoric mood.      Allergies  Review of patient's allergies indicates no known allergies.  Home Medications   Prior to Admission medications   Medication Sig Start Date End Date Taking? Authorizing Provider  citalopram (CELEXA) 40 MG tablet Take 40 mg by mouth daily.   Yes Historical Provider, MD  clonazePAM (KLONOPIN) 0.25 MG disintegrating tablet Take 0.25 mg by mouth daily as needed (For sleep and aniexty).   Yes Historical Provider, MD  esomeprazole (NEXIUM) 40 MG capsule Take 40 mg by mouth daily at 12 noon.   Yes Historical Provider, MD  ibuprofen (ADVIL,MOTRIN) 200 MG tablet Take 600 mg by mouth every 6 (six) hours as needed (For pain).    Yes Historical Provider, MD  Omega-3 Fatty Acids (FISH OIL) 300 MG CAPS Take 300 mg by mouth daily.   Yes Historical Provider, MD  rosuvastatin (CRESTOR) 5 MG tablet Take 5 mg by mouth daily.   Yes Historical Provider, MD  traZODone (DESYREL) 50 MG tablet  Take 50 mg by mouth at bedtime.   Yes Historical Provider, MD   BP 149/88  Pulse 61  Temp(Src) 98.3 F (36.8 C) (Oral)  Resp 16  SpO2 100% Physical Exam  Nursing note and vitals reviewed. Constitutional: He is oriented to person, place, and time. He appears well-developed and well-nourished. No distress.  HENT:  Head: Normocephalic and atraumatic.  Eyes: Conjunctivae are normal.  Neck: Normal range of  motion.  Cardiovascular: Normal rate and regular rhythm.  Exam reveals no gallop and no friction rub.   No murmur heard. Pulmonary/Chest: Effort normal and breath sounds normal. He has no wheezes. He has no rales. He exhibits no tenderness.  Abdominal: Soft. He exhibits no distension. There is no tenderness. There is no rebound and no guarding.  Mild right lower quadrant abdominal tenderness to palpation. No peritoneal signs or other focal tenderness.   Genitourinary:  No CVA tenderness.   Musculoskeletal: Normal range of motion.  Neurological: He is alert and oriented to person, place, and time. Coordination normal.  Speech is goal-oriented. Moves limbs without ataxia.   Skin: Skin is warm and dry.  Psychiatric: He has a normal mood and affect. His behavior is normal.    ED Course  Procedures (including critical care time) Labs Review Labs Reviewed  COMPREHENSIVE METABOLIC PANEL - Abnormal; Notable for the following:    Glucose, Bld 106 (*)    GFR calc non Af Amer 80 (*)    All other components within normal limits  CBC WITH DIFFERENTIAL  LIPASE, BLOOD  URINALYSIS, ROUTINE W REFLEX MICROSCOPIC    Imaging Review Ct Abdomen Pelvis W Contrast  01/08/2014   CLINICAL DATA:  Right lower quadrant pain, flank pain.  EXAM: CT ABDOMEN AND PELVIS WITH CONTRAST  TECHNIQUE: Multidetector CT imaging of the abdomen and pelvis was performed using the standard protocol following bolus administration of intravenous contrast.  CONTRAST:  100mL OMNIPAQUE IOHEXOL 300 MG/ML  SOLN  COMPARISON:  None.  FINDINGS: Lung bases are clear.  No effusions.  Heart is normal size.  Diffuse fatty infiltration of the liver with diffuse low density. No focal abnormality or biliary ductal dilatation. Gallbladder, spleen, pancreas, adrenals and left kidney are unremarkable. There is mild right hydronephrosis and hydroureter with a punctate 2 mm distal right ureteral stone. Slight perinephric stranding on the right. Slight  delayed excretion of contrast from the right kidney.  Appendix is visualized and is normal. Bowel grossly unremarkable. No free fluid, free air, or adenopathy.  Urinary bladder and prostate unremarkable.  No acute bony abnormality or focal bone lesion.  IMPRESSION: 2 mm distal right ureteral stone with mild right hydronephrosis.  Fatty infiltration of the liver.   Electronically Signed   By: Charlett NoseKevin  Dover M.D.   On: 01/08/2014 11:29     EKG Interpretation None      MDM   Final diagnoses:  Right kidney stone    9:21 AM Labs and urinalysis pending. Vitals stable and patient afebrile. Patient will have pain medication.  11:52 AM Patient's CT abdomen pelvis shows 2mm right distal ureter stone. Patient's pain is controlled here. Patient urine shows no signs of infection. Due to the size of the stone, patient will be able to pass this at home. Patient will be discharged with pain and nausea medication. Vitals stable and patient afebrile.      Emilia BeckKaitlyn Noelia Lenart, PA-C 01/08/14 1157

## 2014-01-08 NOTE — ED Provider Notes (Signed)
Medical screening examination/treatment/procedure(s) were performed by non-physician practitioner and as supervising physician I was immediately available for consultation/collaboration.   EKG Interpretation None       Ryan RudeNathan R. Rubin PayorPickering, MD 01/08/14 [phone]

## 2014-01-08 NOTE — ED Notes (Signed)
Family at bedside. 

## 2014-01-08 NOTE — Discharge Instructions (Signed)
Take Vicodin as needed for pain. Take zofran as needed for nausea. Refer to attached documents for more information. Follow up with your doctor as needed.

## 2014-01-08 NOTE — ED Notes (Signed)
Per pt, states right flank pain for about a hour-last 2 days has had urinary frequency-history of kidney stones

## 2014-01-08 NOTE — ED Notes (Signed)
Patient transported to CT 

## 2015-05-28 ENCOUNTER — Ambulatory Visit
Admission: RE | Admit: 2015-05-28 | Discharge: 2015-05-28 | Disposition: A | Discharge status: Home / Self Care | Payer: Commercial Managed Care - HMO | Source: Ambulatory Visit | Attending: Physician Assistant | Admitting: Physician Assistant

## 2015-05-28 ENCOUNTER — Other Ambulatory Visit: Payer: Self-pay | Admitting: Physician Assistant

## 2015-05-28 DIAGNOSIS — M25532 Pain in left wrist: Principal | ICD-10-CM

## 2015-05-28 DIAGNOSIS — M25531 Pain in right wrist: Secondary | ICD-10-CM

## 2016-10-06 DIAGNOSIS — R079 Chest pain, unspecified: Secondary | ICD-10-CM | POA: Diagnosis not present

## 2016-10-06 DIAGNOSIS — E7800 Pure hypercholesterolemia, unspecified: Secondary | ICD-10-CM | POA: Diagnosis not present

## 2016-10-06 DIAGNOSIS — Z23 Encounter for immunization: Secondary | ICD-10-CM | POA: Diagnosis not present

## 2016-10-06 DIAGNOSIS — R0602 Shortness of breath: Secondary | ICD-10-CM | POA: Diagnosis not present

## 2016-10-08 ENCOUNTER — Encounter (INDEPENDENT_AMBULATORY_CARE_PROVIDER_SITE_OTHER): Payer: Commercial Managed Care - HMO

## 2016-10-08 DIAGNOSIS — R0602 Shortness of breath: Secondary | ICD-10-CM

## 2016-10-08 DIAGNOSIS — R079 Chest pain, unspecified: Secondary | ICD-10-CM | POA: Diagnosis not present

## 2016-10-08 LAB — EXERCISE TOLERANCE TEST
Estimated workload: 10.1 METS
Exercise duration (min): 8 min
Exercise duration (sec): 0 s
MPHR: 177 {beats}/min
Peak HR: 166 {beats}/min
Percent HR: 93 %
Percent of predicted max HR: 93 %
RPE: 17
Rest HR: 80 {beats}/min
Stage 1 DBP: 82 mmHg
Stage 1 Grade: 0 %
Stage 1 HR: 87 {beats}/min
Stage 1 SBP: 126 mmHg
Stage 1 Speed: 0 mph
Stage 2 Grade: 0 %
Stage 2 HR: 93 {beats}/min
Stage 2 Speed: 0 mph
Stage 3 Grade: 0 %
Stage 3 HR: 92 {beats}/min
Stage 3 Speed: 1 mph
Stage 4 Grade: 0.1 %
Stage 4 HR: 92 {beats}/min
Stage 4 Speed: 1 mph
Stage 5 DBP: 71 mmHg
Stage 5 Grade: 10 %
Stage 5 HR: 111 {beats}/min
Stage 5 SBP: 136 mmHg
Stage 5 Speed: 1.7 mph
Stage 6 DBP: 67 mmHg
Stage 6 Grade: 12 %
Stage 6 HR: 142 {beats}/min
Stage 6 SBP: 155 mmHg
Stage 6 Speed: 2.5 mph
Stage 7 Grade: 14 %
Stage 7 HR: 166 {beats}/min
Stage 7 Speed: 3.4 mph
Stage 8 DBP: 62 mmHg
Stage 8 Grade: 0 %
Stage 8 HR: 148 {beats}/min
Stage 8 SBP: 174 mmHg
Stage 8 Speed: 1.5 mph
Stage 9 DBP: 69 mmHg
Stage 9 Grade: 0 %
Stage 9 HR: 108 {beats}/min
Stage 9 SBP: 142 mmHg
Stage 9 Speed: 0 mph

## 2017-05-07 DIAGNOSIS — R51 Headache: Secondary | ICD-10-CM | POA: Diagnosis not present

## 2017-05-07 DIAGNOSIS — K219 Gastro-esophageal reflux disease without esophagitis: Secondary | ICD-10-CM | POA: Diagnosis not present

## 2017-05-07 DIAGNOSIS — E7800 Pure hypercholesterolemia, unspecified: Secondary | ICD-10-CM | POA: Diagnosis not present

## 2017-10-15 DIAGNOSIS — G479 Sleep disorder, unspecified: Secondary | ICD-10-CM | POA: Diagnosis not present

## 2017-10-15 DIAGNOSIS — R0683 Snoring: Secondary | ICD-10-CM | POA: Diagnosis not present

## 2018-05-18 DIAGNOSIS — K219 Gastro-esophageal reflux disease without esophagitis: Secondary | ICD-10-CM | POA: Diagnosis not present

## 2018-05-18 DIAGNOSIS — E7800 Pure hypercholesterolemia, unspecified: Secondary | ICD-10-CM | POA: Diagnosis not present

## 2018-08-02 ENCOUNTER — Ambulatory Visit: Payer: 59 | Admitting: Psychiatry

## 2018-08-02 DIAGNOSIS — G4700 Insomnia, unspecified: Secondary | ICD-10-CM | POA: Diagnosis not present

## 2018-08-02 DIAGNOSIS — F411 Generalized anxiety disorder: Secondary | ICD-10-CM | POA: Diagnosis not present

## 2018-08-02 MED ORDER — CITALOPRAM HYDROBROMIDE 10 MG PO TABS: ORAL_TABLET | ORAL | 3 refills | Status: DC

## 2018-08-02 NOTE — Progress Notes (Signed)
[facility]  Patient ID: Ryan NorlanderRandall C Welch,  MRN: [id number]  PCP: Milus Heightedmon, Noelle, PA-C  Date of Evaluation: 08/02/2018 Time spent:20 minutes  Chief Complaint:   HISTORY/CURRENT STATUS: HPI 47 year old white male last seen 06/07/2018.  Diagnoses include anxiety with panic attacks and insomnia.  He was doing well so we continue the same medications.  His medications are Celexa 20 mg a day Remeron 15  mg at bedtime.  Individual Medical History/ Review of Systems: Changes? :No   Allergies: Patient has no known allergies.  Current Medications:  Current Outpatient Medications:  .  esomeprazole (NEXIUM) 40 MG capsule, Take 40 mg by mouth daily at 12 noon., Disp: , Rfl:  .  ibuprofen (ADVIL,MOTRIN) 200 MG tablet, Take 600 mg by mouth every 6 (six) hours as needed (For pain). , Disp: , Rfl:  .  Omega-3 Fatty Acids (FISH OIL) 300 MG CAPS, Take 300 mg by mouth daily., Disp: , Rfl:  .  rosuvastatin (CRESTOR) 5 MG tablet, Take 5 mg by mouth daily., Disp: , Rfl:  .  citalopram (CELEXA) 10 MG tablet, 1 tab/day, Disp: 30 tablet, Rfl: 3 .  HYDROcodone-acetaminophen (NORCO/VICODIN) 5-325 MG per tablet, Take 2 tablets by mouth every 4 (four) hours as needed. (Patient not taking: Reported on 08/02/2018), Disp: 12 tablet, Rfl: 0 .  ondansetron (ZOFRAN ODT) 4 MG disintegrating tablet, Take 1 tablet (4 mg total) by mouth every 8 (eight) hours as needed for nausea or vomiting. (Patient not taking: Reported on 08/02/2018), Disp: 10 tablet, Rfl: 0 Medication Side Effects: none  Family Medical/ Social History: Changes? no  MENTAL HEALTH EXAM:  There were no vitals taken for this visit.There is no height or weight on file to calculate BMI.  General Appearance: Casual  Eye Contact:  Good  Speech:  Clear and Coherent  Volume:  Normal  Mood:  Euthymic  Affect:  Appropriate  Thought Process:  Goal Directed  Orientation:  Full (Time, Place, and Person)  Thought Content: Logical   Suicidal  Thoughts:  No  Homicidal Thoughts:  No  Memory:  WNL  Judgement:  Good  Insight:  Good  Psychomotor Activity:  Normal  Concentration:  Concentration: Good  Recall:  Good  Fund of Knowledge: Good  Language: Good  Assets:  Resilience  ADL's:  Intact  Cognition: WNL  Prognosis:  Good    DIAGNOSES:    ICD-10-CM   1. Anxiety state F41.1   2. Insomnia, unspecified type G47.00     Receiving Psychotherapy: No    RECOMMENDATIONS: No change return in 6 months.   Anne Fulay Meron Bocchino, PA-C

## 2018-11-01 ENCOUNTER — Ambulatory Visit: Payer: 59 | Admitting: Psychiatry

## 2018-11-01 DIAGNOSIS — F411 Generalized anxiety disorder: Secondary | ICD-10-CM | POA: Diagnosis not present

## 2018-11-01 MED ORDER — MIRTAZAPINE 15 MG PO TABS: 15.0000 mg | ORAL_TABLET | Freq: Every day | ORAL | 1 refills | Status: DC

## 2018-11-01 MED ORDER — CITALOPRAM HYDROBROMIDE 10 MG PO TABS: ORAL_TABLET | ORAL | 3 refills | Status: DC

## 2018-11-01 NOTE — Progress Notes (Signed)
[facility]  Patient ID: MERRILL SELDON,  MRN: [id number]  PCP: Milus Height, PA-C  Date of Evaluation: 11/01/2018 Time spent:20 minutes  Chief Complaint:   HISTORY/CURRENT STATUS: HPI patient last seen in November of last year.  He was doing well so we continued his current medications.   He continues to do well.   Individual Medical History/ Review of Systems: Changes? :No   Allergies: Patient has no known allergies.  Current Medications:  Current Outpatient Medications:  .  citalopram (CELEXA) 10 MG tablet, 1 tab/day, Disp: 30 tablet, Rfl: 3 .  esomeprazole (NEXIUM) 40 MG capsule, Take 40 mg by mouth daily at 12 noon., Disp: , Rfl:  .  HYDROcodone-acetaminophen (NORCO/VICODIN) 5-325 MG per tablet, Take 2 tablets by mouth every 4 (four) hours as needed. (Patient not taking: Reported on 08/02/2018), Disp: 12 tablet, Rfl: 0 .  ibuprofen (ADVIL,MOTRIN) 200 MG tablet, Take 600 mg by mouth every 6 (six) hours as needed (For pain). , Disp: , Rfl:  .  mirtazapine (REMERON) 15 MG tablet, Take 1 tablet (15 mg total) by mouth at bedtime., Disp: 90 tablet, Rfl: 1 .  Omega-3 Fatty Acids (FISH OIL) 300 MG CAPS, Take 300 mg by mouth daily., Disp: , Rfl:  .  ondansetron (ZOFRAN ODT) 4 MG disintegrating tablet, Take 1 tablet (4 mg total) by mouth every 8 (eight) hours as needed for nausea or vomiting. (Patient not taking: Reported on 08/02/2018), Disp: 10 tablet, Rfl: 0 .  rosuvastatin (CRESTOR) 5 MG tablet, Take 5 mg by mouth daily., Disp: , Rfl:  Medication Side Effects: none  Family Medical/ Social History: Changes? no  MENTAL HEALTH EXAM:  There were no vitals taken for this visit.There is no height or weight on file to calculate BMI.  General Appearance: Casual  Eye Contact:  Good  Speech:  Clear and Coherent  Volume:  Normal  Mood:  Euthymic  Affect:  Appropriate  Thought Process:  Linear  Orientation:  Full (Time, Place, and Person)  Thought Content: Logical    Suicidal Thoughts:  No  Homicidal Thoughts:  No  Memory:  WNL  Judgement:  Good  Insight:  Good  Psychomotor Activity:  Normal  Concentration:  Concentration: Good  Recall:  Good  Fund of Knowledge: Good  Language: Good  Assets:  Desire for Improvement  ADL's:  Intact  Cognition: WNL  Prognosis:  Good    DIAGNOSES:    ICD-10-CM   1. Anxiety state F41.1     Receiving Psychotherapy: No    RECOMMENDATIONS: Patient wishes to cut his Celexa in half.  He is not sure if he is on 10 mg a day or is on 20 mg a day and taking half.  If he currently is on 20 mg a day and taking 1 to cut that in half if he is currently only taking 10 mg cut that in half and take 5 mg.  Right now he will keep the Remeron 15 mg a day.    Anne Fu, PA-C

## 2018-11-18 DIAGNOSIS — E7800 Pure hypercholesterolemia, unspecified: Secondary | ICD-10-CM | POA: Diagnosis not present

## 2018-11-18 DIAGNOSIS — K219 Gastro-esophageal reflux disease without esophagitis: Secondary | ICD-10-CM | POA: Diagnosis not present

## 2018-12-06 ENCOUNTER — Telehealth: Payer: Self-pay | Admitting: Psychiatry

## 2018-12-06 NOTE — Telephone Encounter (Signed)
Okay to increase mirtazapine back to 30 mg nightly per patient request.

## 2018-12-06 NOTE — Telephone Encounter (Signed)
Patient Remeron was decreased to 15mg . He is requesting to go back on 30mg . Stated it was more effective helping with his sleeping. Please advise

## 2018-12-06 NOTE — Telephone Encounter (Signed)
Left voicemail with information, instructed to call back with pharmacy if needing prescription submitted with 30mg 

## 2018-12-14 ENCOUNTER — Other Ambulatory Visit: Payer: Self-pay

## 2018-12-14 ENCOUNTER — Telehealth: Payer: Self-pay

## 2018-12-14 MED ORDER — MIRTAZAPINE 30 MG PO TABS: 30.0000 mg | ORAL_TABLET | Freq: Every day | ORAL | 1 refills | Status: DC

## 2018-12-14 NOTE — Telephone Encounter (Signed)
rx submitted to pharmacy in [facility]

## 2018-12-14 NOTE — Telephone Encounter (Signed)
Pt needs refill on Remeron 30mg . Needs new prescription sent to [facility]. RX changed from 15mg  to 30mg . Would like 90day supply if possible.

## 2019-03-02 ENCOUNTER — Ambulatory Visit: Payer: 59 | Admitting: Psychiatry

## 2019-03-05 ENCOUNTER — Emergency Department (HOSPITAL_COMMUNITY)
Admission: EM | Admit: 2019-03-05 | Discharge: 2019-03-05 | Disposition: A | Discharge status: Home / Self Care | Payer: 59 | Attending: Emergency Medicine | Admitting: Emergency Medicine

## 2019-03-05 ENCOUNTER — Encounter (HOSPITAL_COMMUNITY): Payer: Self-pay | Admitting: Emergency Medicine

## 2019-03-05 ENCOUNTER — Emergency Department (HOSPITAL_COMMUNITY): Payer: 59

## 2019-03-05 ENCOUNTER — Other Ambulatory Visit: Payer: Self-pay

## 2019-03-05 DIAGNOSIS — Y999 Unspecified external cause status: Secondary | ICD-10-CM | POA: Diagnosis not present

## 2019-03-05 DIAGNOSIS — Y9289 Other specified places as the place of occurrence of the external cause: Secondary | ICD-10-CM | POA: Diagnosis not present

## 2019-03-05 DIAGNOSIS — Y9389 Activity, other specified: Secondary | ICD-10-CM | POA: Insufficient documentation

## 2019-03-05 DIAGNOSIS — S6992XA Unspecified injury of left wrist, hand and finger(s), initial encounter: Secondary | ICD-10-CM | POA: Diagnosis present

## 2019-03-05 DIAGNOSIS — S61213A Laceration without foreign body of left middle finger without damage to nail, initial encounter: Secondary | ICD-10-CM | POA: Diagnosis not present

## 2019-03-05 DIAGNOSIS — Z79899 Other long term (current) drug therapy: Secondary | ICD-10-CM | POA: Diagnosis not present

## 2019-03-05 DIAGNOSIS — W268XXA Contact with other sharp object(s), not elsewhere classified, initial encounter: Secondary | ICD-10-CM | POA: Diagnosis not present

## 2019-03-05 MED ORDER — TETANUS-DIPHTH-ACELL PERTUSSIS 5-2.5-18.5 LF-MCG/0.5 IM SUSP
0.5000 mL | Freq: Once | INTRAMUSCULAR | Status: AC
  Administered 2019-03-05: 0.5 mL via INTRAMUSCULAR
  Filled 2019-03-05: qty 0.5

## 2019-03-05 MED ORDER — LIDOCAINE HCL 2 % IJ SOLN
10.0000 mL | Freq: Once | INTRAMUSCULAR | Status: AC
  Administered 2019-03-05: 200 mg
  Filled 2019-03-05: qty 20

## 2019-03-05 NOTE — ED Provider Notes (Signed)
[facility] DEPT Provider Note   CSN: [id number] Arrival date & time: 03/05/19  1507    History   Chief Complaint Chief Complaint  Patient presents with  . Finger Injury    HPI Ryan Welch is a 48 y.o. male presenting for evaluation of finger injury.  Patient states just prior to arrival he was using a wood cutting tool and a piece of metal slipped and cut his left middle finger.  He reports acute onset bleeding.  Mild pain.  His daughter applied a bandage, and bleeding has been controlled.  He denies numbness of the area.  He denies injury elsewhere.  He is not on blood thinners.  He does not know when his last tetanus shot was, does not think it is up-to-date.     HPI  Past Medical History:  Diagnosis Date  . Allergic rhinitis   . Anxiety   . GERD (gastroesophageal reflux disease)   . Hyperlipidemia   . Renal disorder   . Sleep apnea     Patient Active Problem List   Diagnosis Date Noted  . BACK PAIN 05/10/2009  . OBSTRUCTIVE SLEEP APNEA 10/26/2008  . SOMNOLENCE 10/12/2008  . SNORING 10/12/2008  . HYPERLIPIDEMIA 01/23/2008  . Anxiety state 01/23/2008  . CARPAL TUNNEL SYNDROME 01/23/2008  . ALLERGIC RHINITIS 01/23/2008  . GERD 01/23/2008  . INSOMNIA 01/23/2008    Past Surgical History:  Procedure Laterality Date  . CARPAL TUNNEL RELEASE  10/21/2011   Procedure: CARPAL TUNNEL RELEASE;  Surgeon: Wynonia Sours, MD;  Location: [place];  Service: Orthopedics;  Laterality: Right;  . deviated septum and polyps removed    . ESOPHAGOGASTRODUODENOSCOPY  11/2004   gastritis  . left hand carpal tunnel surgery    . sleep study     2010        Home Medications    Prior to Admission medications   Medication Sig Start Date End Date Taking? Authorizing Provider  citalopram (CELEXA) 10 MG tablet 1 tab/day 11/01/18   Shugart, Lissa Hoard, PA-C  esomeprazole (NEXIUM) 40 MG capsule Take 40 mg by mouth daily at 12 noon.     [provider]  HYDROcodone-acetaminophen (NORCO/VICODIN) 5-325 MG per tablet Take 2 tablets by mouth every 4 (four) hours as needed. Patient not taking: Reported on 08/02/2018 01/08/14   Alvina Chou, PA-C  ibuprofen (ADVIL,MOTRIN) 200 MG tablet Take 600 mg by mouth every 6 (six) hours as needed (For pain).     [provider]  mirtazapine (REMERON) 30 MG tablet Take 1 tablet (30 mg total) by mouth at bedtime. 12/14/18   Shugart, Lissa Hoard, PA-C  Omega-3 Fatty Acids (FISH OIL) 300 MG CAPS Take 300 mg by mouth daily.    [provider]  ondansetron (ZOFRAN ODT) 4 MG disintegrating tablet Take 1 tablet (4 mg total) by mouth every 8 (eight) hours as needed for nausea or vomiting. Patient not taking: Reported on 08/02/2018 01/08/14   Alvina Chou, PA-C  rosuvastatin (CRESTOR) 5 MG tablet Take 5 mg by mouth daily.    [provider]    Family History Family History  Problem Relation Age of Onset  . GER disease Father   . Coronary artery disease Father     Social History Social History   Tobacco Use  . Smoking status: Never Smoker  . Smokeless tobacco: Never Used  Substance Use Topics  . Alcohol use: No  . Drug use: No     Allergies  Patient has no known allergies.   Review of Systems Review of Systems  Skin: Positive for wound.  Hematological: Does not bruise/bleed easily.     Physical Exam Updated Vital Signs BP 133/87 (BP Location: Right Arm)   Pulse 82   Temp 99.3 F (37.4 C) (Oral)   Resp 16   SpO2 100%   Physical Exam Vitals signs and nursing note reviewed.  Constitutional:      General: He is not in acute distress.    Appearance: He is well-developed.  HENT:     Head: Normocephalic and atraumatic.  Neck:     Musculoskeletal: Normal range of motion.  Pulmonary:     Effort: Pulmonary effort is normal.  Abdominal:     General: There is no distension.  Musculoskeletal: Normal range of motion.     Comments: Full  active ROM of the finger. Distal sensation intact. No nail involvement  Skin:    General: Skin is warm.     Capillary Refill: Capillary refill takes less than 2 seconds.     Findings: No rash.     Comments: Jagged wound of distal L middle finger on ulnar side. Minimal active bleeding.   Neurological:     Mental Status: He is alert and oriented to person, place, and time.      ED Treatments / Results  Labs (all labs ordered are listed, but only abnormal results are displayed) Labs Reviewed - No data to display  EKG None  Radiology Dg Finger Middle Left  Result Date: 03/05/2019 CLINICAL DATA:  Laceration EXAM: LEFT MIDDLE FINGER 2+V COMPARISON:  None. FINDINGS: There is no evidence of fracture or dislocation. There is no evidence of arthropathy or other focal bone abnormality. Laceration/partial soft tissue amputation distal third digit. IMPRESSION: No acute osseous abnormality Electronically Signed   By: Jasmine PangKim  Fujinaga M.D.   On: 03/05/2019 15:46    Procedures .Marland Kitchen.Laceration Repair  Date/Time: 03/05/2019 6:14 PM Performed by: Alveria Apleyaccavale, Auden Tatar, PA-C Authorized by: Alveria Apleyaccavale, Skiler Olden, PA-C   Consent:    Consent obtained:  Verbal   Consent given by:  Patient   Risks discussed:  Infection, need for additional repair, nerve damage, pain, poor cosmetic result, poor wound healing and tendon damage Anesthesia (see MAR for exact dosages):    Anesthesia method:  Nerve block   Block location:  L middle finger   Block needle gauge:  25 G   Block anesthetic:  Lidocaine 2% w/o epi   Block technique:  Tendon sheath   Block injection procedure:  Anatomic landmarks identified, introduced needle, incremental injection and anatomic landmarks palpated   Block outcome:  Anesthesia achieved Laceration details:    Location:  Finger   Finger location:  L long finger   Length (cm):  1   Depth (mm):  3 Repair type:    Repair type:  Simple Pre-procedure details:    Preparation:  Patient was  prepped and draped in usual sterile fashion and imaging obtained to evaluate for foreign bodies Exploration:    Hemostasis achieved with:  Tourniquet (finger tourniquet applied)   Wound exploration: wound explored through full range of motion and entire depth of wound probed and visualized     Wound extent: no foreign bodies/material noted, no muscle damage noted, no nerve damage noted, no tendon damage noted and no underlying fracture noted   Treatment:    Area cleansed with:  Betadine   Amount of cleaning:  Extensive Skin repair:    Repair method:  Sutures  Suture size:  4-0   Wound skin closure material used: vicryl rapide.   Suture technique:  Simple interrupted   Number of sutures:  4 Approximation:    Approximation:  Close Post-procedure details:    Dressing:  Bulky dressing   Patient tolerance of procedure:  Tolerated well, no immediate complications   (including critical care time)  Medications Ordered in ED Medications  Tdap (BOOSTRIX) injection 0.5 mL (0.5 mLs Intramuscular Given 03/05/19 1811)  lidocaine (XYLOCAINE) 2 % (with pres) injection 200 mg (200 mg Other Given by Other 03/05/19 1810)     Initial Impression / Assessment and Plan / ED Course  I have reviewed the triage vital signs and the nursing notes.  Pertinent labs & imaging results that were available during my care of the patient were reviewed by me and considered in my medical decision making (see chart for details).        Patient presenting for evaluation of finger injury.  Physical exam shows patient is neurovascularly intact.  X-ray viewed interpreted by me, no fracture ot dislocation.  Wound is jagged, which will repair difficult.  However, as he is still having bleeding will attempt placing stitches to minimize bleeding.  Lac repaired as described above.  Discussed aftercare instructions.  Discussed monitoring for signs of infection.  At this time, patient appears safe for discharge.  Patient  states he understands and agrees to plan.  Final Clinical Impressions(s) / ED Diagnoses   Final diagnoses:  Laceration of left middle finger without foreign body without damage to nail, initial encounter    ED Discharge Orders    None       Alveria ApleyCaccavale, Alyrica Thurow, PA-C 03/05/19 1911    Terrilee FilesButler, Michael C, MD 03/06/19 [phone]

## 2019-03-05 NOTE — Discharge Instructions (Addendum)
1. Medications: Tylenol or ibuprofen for pain, usual home medications 2. Treatment: ice for swelling, keep wound clean with warm soap and water and keep bandage dry, do not submerge in water for 24 hours 3. Follow Up: Return to the emergency department for increased redness, drainage of pus from the wound   WOUND CARE  Keep area clean and dry for 24 hours. Do not remove bandage, if applied.  After 24 hours, remove bandage and wash wound gently with mild soap and warm water. Reapply a new bandage after cleaning wound.   Continue daily cleansing with soap and water until stitches dissolve.  Do not apply any ointments or creams to the wound while stitches are in place, as this may cause delayed healing. Return if you experience any of the following signs of infection: Swelling, redness, pus drainage, streaking, fever >101.0 F  Return if you experience excessive bleeding that does not stop after 15-20 minutes of constant, firm pressure.

## 2019-03-05 NOTE — ED Triage Notes (Signed)
Pt reports cut left middle finger that got caught on router bit.

## 2019-03-30 ENCOUNTER — Ambulatory Visit: Payer: 59 | Admitting: Psychiatry

## 2019-03-30 ENCOUNTER — Other Ambulatory Visit: Payer: Self-pay

## 2019-03-30 ENCOUNTER — Encounter: Payer: Self-pay | Admitting: Psychiatry

## 2019-03-30 VITALS — Ht 69.0 in | Wt 200.0 lb

## 2019-03-30 DIAGNOSIS — F411 Generalized anxiety disorder: Secondary | ICD-10-CM

## 2019-03-30 DIAGNOSIS — G4733 Obstructive sleep apnea (adult) (pediatric): Secondary | ICD-10-CM | POA: Diagnosis not present

## 2019-03-30 MED ORDER — CITALOPRAM HYDROBROMIDE 20 MG PO TABS: 20.0000 mg | ORAL_TABLET | Freq: Every day | ORAL | 1 refills | Status: DC

## 2019-03-30 MED ORDER — MIRTAZAPINE 30 MG PO TABS: 30.0000 mg | ORAL_TABLET | Freq: Every day | ORAL | 1 refills | Status: DC

## 2019-03-30 NOTE — Progress Notes (Signed)
[facility]  Patient ID: Ryan NorlanderRandall C Welch,  MRN: [id number]  PCP: Milus Heightedmon, Noelle, PA-C  Date of Evaluation: 03/30/2019 Time spent:20 minutes from 1100 to 1120  Chief Complaint:  Chief Complaint    Anxiety; Fatigue; Agitation      HISTORY/CURRENT STATUS: Ryan Welch is seen onsite in office face-to-face individually with consent with epic and old office chart of previous provider Anne FuClay Shugart now deceased for psychiatric interview and exam in 2955-month evaluation and management of anxiety and insomnia contributing to fatigue and irritability worse as he attempts to taper his medication and better when fully established on the long-term regimen of 5 years by [employer] Shugart.  Ryan Welch is anxiously wishful that the anxiety would just resolve and not need more medication.  He does have medication for lipids, acid reflux, and allergic rhinitis also.  In the interim since last appointment, his reduction of citalopram to 5 mg daily resulted in anxious symptoms requiring for 1-1/2 months resuming 10 mg every morning of citalopram as he also increased Remeron again from 15 to 30 mg.   registry documents no controlled substance.  He enjoys his job getting up with intent every morning though often tired and sleepy.  He had a PSG 10 years ago never falling asleep in the lab that was too busy concluded to have sleep apnea that was mild but could be tried on CPAP, though the CPAP did little to help.  He must pay $400 to have another sleep study and has been deferring it year to year relative to deductibles and family health expenditures such as 46 year old son's diabetes mellitus and 46 year old daughters individuation expenses. Ryan Welch can reconcile by the end of the session that maintaining his sufficient doses of his antianxiety medication is best, as he had anxiety even in early childhood. He has no delirium, mania, suicidality, or psychosis.  Anxiety Presents for follow-up visit. Symptoms include decreased  concentration, excessive worry, insomnia, irritability, malaise, muscle tension, nausea, nervous/anxious behavior, panic and restlessness. Patient reports no compulsions, obsessions or suicidal ideas. Symptoms occur most days. The severity of symptoms is moderate, causing significant distress and interfering with daily activities. The quality of sleep is non-restorative. Nighttime awakenings: several.   Compliance with medications is 51-75%. Side effects of treatment include GI discomfort.    Individual Medical History/ Review of Systems: Changes? :Yes Crestor for lipids, Zofran and Nexium for reflux, and Flonase and Claritin for allergic rhinitis.  Allergies: Patient has no known allergies.  Current Medications:  Current Outpatient Medications:  .  citalopram (CELEXA) 20 MG tablet, Take 1 tablet (20 mg total) by mouth daily after breakfast. 1 tab/day, Disp: 90 tablet, Rfl: 1 .  esomeprazole (NEXIUM) 40 MG capsule, Take 40 mg by mouth daily at 12 noon., Disp: , Rfl:  .  HYDROcodone-acetaminophen (NORCO/VICODIN) 5-325 MG per tablet, Take 2 tablets by mouth every 4 (four) hours as needed. (Patient not taking: Reported on 08/02/2018), Disp: 12 tablet, Rfl: 0 .  ibuprofen (ADVIL,MOTRIN) 200 MG tablet, Take 600 mg by mouth every 6 (six) hours as needed (For pain). , Disp: , Rfl:  .  mirtazapine (REMERON) 30 MG tablet, Take 1 tablet (30 mg total) by mouth at bedtime., Disp: 90 tablet, Rfl: 1 .  Omega-3 Fatty Acids (FISH OIL) 300 MG CAPS, Take 300 mg by mouth daily., Disp: , Rfl:  .  ondansetron (ZOFRAN ODT) 4 MG disintegrating tablet, Take 1 tablet (4 mg total) by mouth every 8 (eight) hours as needed for nausea or vomiting. (  Patient not taking: Reported on 08/02/2018), Disp: 10 tablet, Rfl: 0 .  rosuvastatin (CRESTOR) 5 MG tablet, Take 5 mg by mouth daily., Disp: , Rfl:    Medication Side Effects: none  Family Medical/ Social History: Changes? Yes son now a middle teen and daughter a young adult  require much resource from the family.  MENTAL HEALTH EXAM:  Height 5\' 9"  (1.753 m), weight 200 lb (90.7 kg).Body mass index is 29.53 kg/m.  Deferred as nonessential in coronavirus pandemic  General Appearance: Casual, Fairly Groomed and Guarded  Eye Contact:  Fair  Speech:  Clear and Coherent, Normal Rate and Talkative  Volume:  Normal  Mood:  Anxious, Euthymic, Irritable and Worthless  Affect:  Inappropriate, Restricted and Anxious  Thought Process:  Disorganized, Goal Directed and Linear  Orientation:  Full (Time, Place, and Person)  Thought Content: Ilusions, Obsessions and Rumination   Suicidal Thoughts:  No  Homicidal Thoughts:  No  Memory:  Immediate;   Good Remote;   Fair  Judgement:  Fair  Insight:  Fair  Psychomotor Activity:  Normal, Decreased, Mannerisms and Restlessness  Concentration:  Concentration: Fair and Attention Span: Good  Recall:  [employer] of Knowledge: Good  Language: Fair  Assets:  Desire for Improvement Resilience Talents/Skills  ADL's:  Intact  Cognition: WNL  Prognosis:  Good    DIAGNOSES:    ICD-10-CM   1. Generalized anxiety disorder  F41.1 citalopram (CELEXA) 20 MG tablet    mirtazapine (REMERON) 30 MG tablet  2. OBSTRUCTIVE SLEEP APNEA  G47.33 mirtazapine (REMERON) 30 MG tablet    Receiving Psychotherapy: No    RECOMMENDATIONS: Psycho supportive psychoeducation addresses symptom treatment matching for diagnoses and legacy of medications.  The patient sends a text to wife as they attempt to clarify whether he needs a specialty mail out from [facility] or just the routine mail service for his prescriptions as he requests the citalopram like the mirtazapine be a 90-day supply from [facility].  Citalopram is restored to 20 mg every morning from recent 5 then 10 mg after breakfast sent as #90 with 1 refill to [facility] at [facility] for generalized anxiety.  His current mirtazapine dose is 30 mg every bedtime helping sleep apnea and  generalized anxiety sent as #90 with 1 refill to [facility].  Return is in 5 to 6 months for follow-up as he allows clarification of options relative to treatment needs.  He understands prevention and monitoring, safety hygiene, and crisis plans if needed.   Delight Hoh, MD

## 2019-08-16 ENCOUNTER — Other Ambulatory Visit: Payer: Self-pay | Admitting: Psychiatry

## 2019-08-16 DIAGNOSIS — F411 Generalized anxiety disorder: Secondary | ICD-10-CM

## 2019-10-02 ENCOUNTER — Encounter: Payer: Self-pay | Admitting: Psychiatry

## 2019-10-02 ENCOUNTER — Ambulatory Visit (INDEPENDENT_AMBULATORY_CARE_PROVIDER_SITE_OTHER): Payer: 59 | Admitting: Psychiatry

## 2019-10-02 VITALS — Ht 69.0 in | Wt 198.0 lb

## 2019-10-02 DIAGNOSIS — F411 Generalized anxiety disorder: Secondary | ICD-10-CM | POA: Diagnosis not present

## 2019-10-02 DIAGNOSIS — G4733 Obstructive sleep apnea (adult) (pediatric): Secondary | ICD-10-CM | POA: Diagnosis not present

## 2019-10-02 MED ORDER — CITALOPRAM HYDROBROMIDE 20 MG PO TABS: 20.0000 mg | ORAL_TABLET | Freq: Every day | ORAL | 3 refills | Status: DC

## 2019-10-02 MED ORDER — MIRTAZAPINE 30 MG PO TABS: 30.0000 mg | ORAL_TABLET | Freq: Every day | ORAL | 3 refills | Status: DC

## 2019-10-02 NOTE — Progress Notes (Signed)
[facility]  Patient ID: Ryan Welch,  MRN: [id number]  PCP: Lennie Odor, PA-C  Date of Evaluation: 10/02/2019 Time spent:20 minutes from 1145 to 1205  Chief Complaint:   HISTORY/CURRENT STATUS: Ryan Welch is provided telemedicine audiovisual appointment session, declining the video camera for his generalized anxiety, phone to phone 20 minutes individually with consent with epic collateral for psychiatric interview and exam in 9-month evaluation and management of generalized anxiety and obstructive sleep apnea.  The patient was ambivalent about medications and treatment overall last appointment but is much more consolidated and competent about his treatment at this time.  He can review in detail his current medications for all conditions with interim changes stating wife helps somewhat but he primarily takes care of this.  His Celexa is now dosed at bedtime 20 mg nightly along with the Remeron 30 mg nightly, and he has not gained weight in fact losing a couple of pounds in the interim so that neither medication has been metabolically detrimental.  Nexium was changed to Prilosec for GERD and Crestor is changed to Mevacor by his primary care for his hyperlipidemia since his 6s which is familial.  He requires no medication now until spring for his allergic rhinitis.  He still plans another sleep study soon for his obstructive sleep apnea still snoring significantly without apnea observed by wife or others.  Still he is tired in the day with fatigue and drowsiness so that he would like to complete his PCP recommended sleep study again with the last one being now over 7 years ago.  He states that his former sleep study showed no stage IV sleep being suspended in REM.  The patient can discuss all these matters excellently and is documenting excellent self-care so that less frequent monitoring of his maintenance Celexa and Remeron is necessary.  It does not appear that the Remeron causes  excessive sedation or weight gain, and in fact he states it just effectively helps him sleep at night without being overwhelming in any way.  He has no mania, suicidality, psychosis or delirium.  Anxiety  Presents for follow-up visit. Symptoms include decreased concentration, excessive worry, insomnia, nausea, nervous/anxious behavior. Patient reports no panic, restlessness, compulsions, obsessions, irritability, malaise, muscle tension, or suicidal ideas. Symptoms occur most days. The severity of symptoms is moderate, causing significant distress and interfering with daily activities. The quality of sleep is non-restorative. Nighttime awakenings: several.    Individual Medical History/ Review of Systems: Changes? :No He describes with established routine and consistency has treatment for GERD, familial hyperlipidemia, sleep apnea, and allergic rhinitis.  Weight is down 2 pounds over 6 months ago.  Allergies: Patient has no known allergies.  Current Medications:  Current Outpatient Medications:  .  lovastatin (MEVACOR) 20 MG tablet, Take 20 mg by mouth at bedtime., Disp: , Rfl:  .  omeprazole (PRILOSEC) 40 MG capsule, Take 40 mg by mouth daily before breakfast., Disp: , Rfl:  .  citalopram (CELEXA) 20 MG tablet, Take 1 tablet (20 mg total) by mouth at bedtime., Disp: 90 tablet, Rfl: 3 .  HYDROcodone-acetaminophen (NORCO/VICODIN) 5-325 MG per tablet, Take 2 tablets by mouth every 4 (four) hours as needed. (Patient not taking: Reported on 08/02/2018), Disp: 12 tablet, Rfl: 0 .  ibuprofen (ADVIL,MOTRIN) 200 MG tablet, Take 600 mg by mouth every 6 (six) hours as needed (For pain). , Disp: , Rfl:  .  mirtazapine (REMERON) 30 MG tablet, Take 1 tablet (30 mg total) by mouth at bedtime., Disp:  90 tablet, Rfl: 3 .  Omega-3 Fatty Acids (FISH OIL) 300 MG CAPS, Take 300 mg by mouth daily., Disp: , Rfl:  .  ondansetron (ZOFRAN ODT) 4 MG disintegrating tablet, Take 1 tablet (4 mg total) by mouth every 8 (eight)  hours as needed for nausea or vomiting. (Patient not taking: Reported on 08/02/2018), Disp: 10 tablet, Rfl: 0  Medication Side Effects: none  Family Medical/ Social History: Changes? No  MENTAL HEALTH EXAM:  There were no vitals taken for this visit.There is no height or weight on file to calculate BMI.  As not present here today.  General Appearance: N/A  Eye Contact:  N/A  Speech:  Clear and Coherent, Normal Rate and Talkative  Volume:  Normal  Mood:  Anxious and Euthymic  Affect:  Congruent, Inappropriate, Restricted and Anxious  Thought Process:  Coherent, Goal Directed, Irrelevant and Descriptions of Associations: Circumstantial  Orientation:  Full (Time, Place, and Person)  Thought Content: Obsessions and Rumination   Suicidal Thoughts:  No  Homicidal Thoughts:  No  Memory:  Immediate;   Good Remote;   Good  Judgement:  Good  Insight:  Fair  Psychomotor Activity:  N/A  Concentration:  Concentration: Fair and Attention Span: Good  Recall:  Good  Fund of Knowledge: Good  Language: Good  Assets:  Leisure Time Social Support Vocational/Educational  ADL's:  Intact  Cognition: WNL  Prognosis:  Good    DIAGNOSES:    ICD-10-CM   1. Generalized anxiety disorder  F41.1 citalopram (CELEXA) 20 MG tablet    mirtazapine (REMERON) 30 MG tablet  2. OBSTRUCTIVE SLEEP APNEA  G47.33 mirtazapine (REMERON) 30 MG tablet    Receiving Psychotherapy: No    RECOMMENDATIONS: Over 50% of the 20-minute face-to-face time for a total of 10 minutes counseling and coordination of care is spent on consolidating and reinforcing the ongoing acquisition of exposure desensitization response prevention.  Sleep hygiene, behavioral nutrition, object relations and frustration management interventions are integrated with symptom treatment matching with medication concluding need to continue Celexa and Remeron in current maintenance fashion.  Thereby, he prefers to return in 1 year likely to have sleep  study in the interim.  [place] registry is negative and he continues his general medical care with PCP.  He is E scribed Celexa 20 mg every bedtime sent as a 8-month supply and 3 refills to [facility] mail service for generalized anxiety.  He is E scribed Remeron 15 mg every bedtime sent as a 61-month supply and 3 refills to [facility] mail service for sleep apnea and generalized anxiety.  He returns in 1 year for follow up or sooner if needed.  Virtual Visit via Video Note  I connected with Ryan Welch on 10/02/19 at 11:40 AM EST by a video enabled telemedicine application and verified that I am speaking with the correct person using two identifiers.  Location: Patient: Audio individually at home residence patient declining video camera for generalized anxiety Provider: [facility] office   I discussed the limitations of evaluation and management by telemedicine and the availability of in person appointments. The patient expressed understanding and agreed to proceed.  History of Present Illness: 27-month evaluation and management address generalized anxiety and obstructive sleep apnea.  The patient was ambivalent about medications and treatment overall last appointment but is much more consolidated and competent about his treatment at this time.   Observations/Objective: Mood:  Anxious and Euthymic  Affect:  Congruent, Inappropriate, Restricted and Anxious  Thought Process:  Coherent, Goal Directed, Irrelevant and Descriptions of Associations: Circumstantial  Orientation:  Full (Time, Place, and Person)  Thought Content: Obsessions and Rumination    Assessment and Plan: Over 50% of the 20-minute face-to-face time for a total of 10 minutes counseling and coordination of care is spent on consolidating and reinforcing the ongoing acquisition of exposure desensitization response prevention.  Sleep hygiene, behavioral nutrition, object relations and frustration management interventions are  integrated with symptom treatment matching with medication concluding need to continue Celexa and Remeron in current maintenance fashion.  Thereby, he prefers to return in 1 year likely to have sleep study in the interim.  [place] registry is negative and he continues his general medical care with PCP.  He is E scribed Celexa 20 mg every bedtime sent as a 78-month supply and 3 refills to [facility] mail service for generalized anxiety.  He is E scribed Remeron 15 mg every bedtime sent as a 95-month supply and 3 refills to [facility] mail service for sleep apnea and generalized anxiety.  Follow Up Instructions: He returns in 1 year for follow up or sooner if needed.    I discussed the assessment and treatment plan with the patient. The patient was provided an opportunity to ask questions and all were answered. The patient agreed with the plan and demonstrated an understanding of the instructions.   The patient was advised to call back or seek an in-person evaluation if the symptoms worsen or if the condition fails to improve as anticipated.  I provided 20 minutes of non-face-to-face time during this encounter. [employer] meeting #[id number] Meeting password: [email or web address]  Chauncey Mann, MD  Chauncey Mann, MD

## 2019-11-28 ENCOUNTER — Other Ambulatory Visit: Payer: Self-pay | Admitting: Physician Assistant

## 2019-11-28 ENCOUNTER — Ambulatory Visit
Admission: RE | Admit: 2019-11-28 | Discharge: 2019-11-28 | Disposition: A | Discharge status: Home / Self Care | Payer: 59 | Source: Ambulatory Visit | Attending: Physician Assistant | Admitting: Physician Assistant

## 2019-11-28 DIAGNOSIS — M25552 Pain in left hip: Secondary | ICD-10-CM

## 2020-01-29 ENCOUNTER — Encounter: Payer: Self-pay | Admitting: Psychiatry

## 2020-01-29 ENCOUNTER — Encounter (INDEPENDENT_AMBULATORY_CARE_PROVIDER_SITE_OTHER): Payer: Self-pay

## 2020-01-29 ENCOUNTER — Ambulatory Visit (INDEPENDENT_AMBULATORY_CARE_PROVIDER_SITE_OTHER): Payer: 59 | Admitting: Psychiatry

## 2020-01-29 ENCOUNTER — Other Ambulatory Visit: Payer: Self-pay

## 2020-01-29 VITALS — Ht 69.0 in | Wt 213.0 lb

## 2020-01-29 DIAGNOSIS — G4733 Obstructive sleep apnea (adult) (pediatric): Secondary | ICD-10-CM

## 2020-01-29 DIAGNOSIS — F411 Generalized anxiety disorder: Secondary | ICD-10-CM

## 2020-01-29 DIAGNOSIS — F341 Dysthymic disorder: Secondary | ICD-10-CM

## 2020-01-29 MED ORDER — ESCITALOPRAM OXALATE 20 MG PO TABS: 20.0000 mg | ORAL_TABLET | Freq: Every day | ORAL | 1 refills | Status: DC

## 2020-01-29 MED ORDER — BUPROPION HCL ER (XL) 150 MG PO TB24: 150.0000 mg | ORAL_TABLET | Freq: Every day | ORAL | 1 refills | Status: DC

## 2020-01-29 NOTE — Progress Notes (Signed)
[facility]  Patient ID: Ryan Welch,  MRN: [id number]  PCP: Lennie Odor, PA  Date of Evaluation: 01/29/2020 Time spent:25 minutes from [place] to1015  Chief Complaint:  Chief Complaint    Anxiety; Depression      HISTORY/CURRENT STATUS: Ryan Welch is seen onsite in office 25 minutes face-to-face individually with consent with epic collateral for psychiatric interview and exam in 42-month evaluation and generalized anxiety disorder, confirmed in the interim obstructive sleep apnea, and chronic depression with melancholic features he discussed in the past as he anxiously limited his symptom consideration and treatment options.  However, he is the best father he can be noting that his wife still has strength in acceptance of his needs.  This winter he has been treated by PCP with vitamin D below the normal level brought up to sufficient with improvement in general well being but depression is no better.  His sleep study revealed sleep apnea requiring CPAP started by patient expecting to be overall better though depression the same despite his apnea episodes being down from 25 to 6 per hour and he expects a cumulative benefit over time.  Children are teenagers ages 64 and 72 years and he attempts family activities for wife and teens.  However, his outcome is modest as he is mentally tired with low motivation and no interest or pleasure.  He tried to stop his medications for several weeks without benefit in fact symptoms getting worse.  He has therefore continued the Celexa 20 mg nightly and Remeron 30 mg nightly but has now gained 15 pounds in the last 4 months finding himself irritable needing more help.  He has taken Celexa since April 2015 some 6 years ago though the dose of 40 mg was reduced to 20 mg in the spring 2018 as Remeron was being titrated upward.  He has hesitancy about change of his medications but finds himself in need of refills, relief of depression, and recovery of his  focus, interest, capacity for pleasure, and social involvement in the family.  He has no mania, suicidality, psychosis, substance use, or delirium.  Anxiety             Presents forfollow-upvisit. Symptoms includedecreased concentration,excessive worry,insomnia,fatigue, nausea,nervous/anxious behavior, decreased interest, and low energy and motivation. Symptoms do not include phobia,  panic, restlessness, compulsions,obsessions, irritability,malaise,muscle tension,or suicidal ideas. Symptoms occurmost days. The severity of symptoms ismoderate, causing significant distress and interfering with daily activities. The quality of sleep isnon-restorative. Nighttime awakenings:several.   Individual Medical History/ Review of Systems: Changes? :Yes   Allergies: Patient has no known allergies.  Current Medications:  Current Outpatient Medications:  .  buPROPion (WELLBUTRIN XL) 150 MG 24 hr tablet, Take 1 tablet (150 mg total) by mouth daily after breakfast., Disp: 30 tablet, Rfl: 1 .  escitalopram (LEXAPRO) 20 MG tablet, Take 1 tablet (20 mg total) by mouth at bedtime., Disp: 30 tablet, Rfl: 1 .  HYDROcodone-acetaminophen (NORCO/VICODIN) 5-325 MG per tablet, Take 2 tablets by mouth every 4 (four) hours as needed. (Patient not taking: Reported on 08/02/2018), Disp: 12 tablet, Rfl: 0 .  ibuprofen (ADVIL,MOTRIN) 200 MG tablet, Take 600 mg by mouth every 6 (six) hours as needed (For pain). , Disp: , Rfl:  .  lovastatin (MEVACOR) 20 MG tablet, Take 20 mg by mouth at bedtime., Disp: , Rfl:  .  Omega-3 Fatty Acids (FISH OIL) 300 MG CAPS, Take 300 mg by mouth daily., Disp: , Rfl:  .  omeprazole (PRILOSEC) 40 MG capsule, Take  40 mg by mouth daily before breakfast., Disp: , Rfl:  .  ondansetron (ZOFRAN ODT) 4 MG disintegrating tablet, Take 1 tablet (4 mg total) by mouth every 8 (eight) hours as needed for nausea or vomiting. (Patient not taking: Reported on 08/02/2018), Disp: 10 tablet, Rfl:  0 Medication Side Effects: anxiety, fatigue/weakness and weight gain  Family Medical/ Social History: Changes? No  MENTAL HEALTH EXAM:  Height 5\' 9"  (1.753 m), weight 213 lb (96.6 kg).Body mass index is 31.45 kg/m. Muscle strengths and tone 5/5, postural reflexes and gait 0/0, and AIMS = 0.  General Appearance: Casual, Fairly Groomed, Guarded and Obese  Eye Contact:  Fair  Speech:  Clear and Coherent, Normal Rate and Talkative  Volume:  Normal  Mood:  Anxious, Depressed, Dysphoric, Hopeless and Worthless  Affect:  Congruent, Depressed, Restricted and Anxious  Thought Process:  Coherent, Goal Directed, Irrelevant and Descriptions of Associations: Circumstantial  Orientation:  Full (Time, Place, and Person)  Thought Content: Obsessions and Rumination   Suicidal Thoughts:  No  Homicidal Thoughts:  No  Memory:  Immediate;   Good Remote;   Good  Judgement:  Good  Insight:  Good  Psychomotor Activity:  Normal, Decreased, Mannerisms and Psychomotor Retardation  Concentration:  Concentration: Fair and Attention Span: Good  Recall:  Good  Fund of Knowledge: Good  Language: Good  Assets:  Desire for Improvement Intimacy Resilience Talents/Skills  ADL's:  Intact  Cognition: WNL  Prognosis:  Good    DIAGNOSES:    ICD-10-CM   1. 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 OBSTRUCTIVE SLEEP APNEA  G47.33     Receiving Psychotherapy: No    RECOMMENDATIONS: The patient's self examination for understanding options of therapeutic change being reinforced as recruited are integrated with symptom treatment matching for medication changes.  Remeron is best discontinued considering the progressive weight gain and sleep apnea on the sleep study.   As efficacy is now insufficient from Celexa which has helped in the past, Celexa is changed to Lexapro 20 mg tablet taking 1/2 tablet for 2 to 6 days then advance to 1 nightly sent as #30 with [facility] .  He is tarted on Wellbutrin 150 mg XL every morning sent as #30 with 1 refill to [facility].  His CPAP is to continue.  He requires 90-day supply to [facility] when medication dosing is stabilized.  He returns for follow-up therefore in 4 weeks or sooner if needed understanding prevention and monitoring safety hygiene.   [employer] MD

## 2020-03-05 ENCOUNTER — Other Ambulatory Visit: Payer: Self-pay

## 2020-03-05 ENCOUNTER — Ambulatory Visit (INDEPENDENT_AMBULATORY_CARE_PROVIDER_SITE_OTHER): Payer: 59 | Admitting: Psychiatry

## 2020-03-05 ENCOUNTER — Encounter: Payer: Self-pay | Admitting: Psychiatry

## 2020-03-05 VITALS — BP 114/74 | HR 78 | Ht 69.0 in | Wt 211.0 lb

## 2020-03-05 DIAGNOSIS — F411 Generalized anxiety disorder: Secondary | ICD-10-CM | POA: Diagnosis not present

## 2020-03-05 DIAGNOSIS — G4720 Circadian rhythm sleep disorder, unspecified type: Secondary | ICD-10-CM

## 2020-03-05 DIAGNOSIS — G4733 Obstructive sleep apnea (adult) (pediatric): Secondary | ICD-10-CM

## 2020-03-05 DIAGNOSIS — F341 Dysthymic disorder: Secondary | ICD-10-CM

## 2020-03-05 MED ORDER — ESCITALOPRAM OXALATE 20 MG PO TABS: 20.0000 mg | ORAL_TABLET | Freq: Every day | ORAL | 1 refills | Status: DC

## 2020-03-05 MED ORDER — ARMODAFINIL 150 MG PO TABS: 150.0000 mg | ORAL_TABLET | Freq: Every day | ORAL | 1 refills | Status: DC

## 2020-03-05 NOTE — Progress Notes (Signed)
[facility]  Patient ID: Ryan Welch,  MRN: [id number]  PCP: Lennie Odor, PA  Date of Evaluation: 03/05/2020 Time spent:20 minutes from 0925 to 0945  Chief Complaint:  Chief Complaint    Anxiety; Depression; Headache      HISTORY/CURRENT STATUS: Ryan Welch is seen onsite in office 20 minutes face-to-face individually with consent with epic collateral for psychiatric interview and exam in 4-week evaluation and management of generalized anxiety, persistent depression, and sleep disorder currently treated for obstructive sleep apnea with CPAP still being newly adjusted.  Ryan Welch has the curious experience of improving mood and function as he started Wellbutrin 150 mg XL every morning but also developing headaches that may last all day from the occiput over the frontal scalp with increasing blood pressure whether one causes the other or vice versa with both seeming worse with stress.  He had no headache 1 day last week and this morning when he got up and his blood pressure at home yesterday was 132/87 and is normal in the office today.  He is off of Remeron changing to melatonin 10 mg nightly to facilitate sleep needing to stop the Wellbutrin for adverse effects such as headache when the Lexapro is well-tolerated and helpful  He does take Prilosec in the morning.  Patient seems somewhat less dependent on wife and is competent with his CPAP, but still daily headache and elevated blood pressure as well as compromised sleep raise question of whether sleep wake patterning is more important to relief of anxiety/depression now than changing the Lexapro while off the Wellbutrin.  He has no mania, suicidality, psychosis or delirium.  Anxiety Presents forfollow-upvisit. Symptoms includedecreased concentration,excessive worry,insomnia,fatigue,nervous/anxious behavior, decreased interest, guilty self-deprecation, low energy, and apathetic motivation. Symptoms do not include  panic, nausea, dissociation,  phobic avoidance, restlessness,compulsions,obsessions,irritability,malaise,muscle tension,or suicidal ideas. Symptoms occurmost days. The severity of symptoms ismoderate, causing significant distress and interfering with daily activities. The quality of sleep isnon-restorative. Nighttime awakenings:several.  Individual Medical History/ Review of Systems: Changes? :Yes Is down 2 pounds and blood pressure was normal at home yesterday 132/87.  Allergies: Patient has no known allergies.  Current Medications:  Current Outpatient Medications:  .  Armodafinil 150 MG tablet, Take 1 tablet (150 mg total) by mouth daily after breakfast., Disp: 30 tablet, Rfl: 1 .  escitalopram (LEXAPRO) 20 MG tablet, Take 1 tablet (20 mg total) by mouth at bedtime., Disp: 30 tablet, Rfl: 1 .  HYDROcodone-acetaminophen (NORCO/VICODIN) 5-325 MG per tablet, Take 2 tablets by mouth every 4 (four) hours as needed. (Patient not taking: Reported on 08/02/2018), Disp: 12 tablet, Rfl: 0 .  ibuprofen (ADVIL,MOTRIN) 200 MG tablet, Take 600 mg by mouth every 6 (six) hours as needed (For pain). , Disp: , Rfl:  .  lovastatin (MEVACOR) 20 MG tablet, Take 20 mg by mouth at bedtime., Disp: , Rfl:  .  Omega-3 Fatty Acids (FISH OIL) 300 MG CAPS, Take 300 mg by mouth daily., Disp: , Rfl:  .  omeprazole (PRILOSEC) 40 MG capsule, Take 40 mg by mouth daily before breakfast., Disp: , Rfl:  .  ondansetron (ZOFRAN ODT) 4 MG disintegrating tablet, Take 1 tablet (4 mg total) by mouth every 8 (eight) hours as needed for nausea or vomiting. (Patient not taking: Reported on 08/02/2018), Disp: 10 tablet, Rfl: 0  Medication Side Effects: headache blood pressure fluctuation at times elevated  Family Medical/ Social History: Changes? No  MENTAL HEALTH EXAM:  Blood pressure 114/74, pulse 78, height 5\' 9"  (1.753 m), weight  211 lb (95.7 kg).Body mass index is 31.16 kg/m.  General Appearance: Casual, Fairly Groomed,  Guarded and Obese  Eye Contact:  Fair  Speech:  Clear and Coherent, Normal Rate and Talkative  Volume:  Normal  Mood:  Anxious, Depressed, Dysphoric and Euthymic  Affect:  Congruent, Depressed, Inappropriate and Anxious  Thought Process:  Coherent, Goal Directed, Irrelevant and Descriptions of Associations: Circumstantial  Orientation:  Full (Time, Place, and Person)  Thought Content: Obsessions and Rumination   Suicidal Thoughts:  No  Homicidal Thoughts:  No  Memory:  Immediate;   Good Remote;   Good  Judgement:  Good  Insight:  Fair  Psychomotor Activity:  Normal, Decreased and Mannerisms  Concentration:  Concentration: Fair and Attention Span: Good  Recall:  Good  Fund of Knowledge: Good  Language: Good  Assets:  Desire for Improvement Intimacy Resilience Talents/Skills  ADL's:  Intact  Cognition: WNL  Prognosis:  Good    DIAGNOSES:    ICD-10-CM   1. 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 OBSTRUCTIVE SLEEP APNEA  G47.33 Armodafinil 150 MG tablet    Receiving Psychotherapy: No    RECOMMENDATIONS: Patient tends to have self-deprecating blame of self but is less dependent on wife in this way.  Hopeless apathetic features are improving but self doubt with terminal component to insomnia and diurnal variation to headache continue to be targets for treatment.  As Wellbutrin helped mood but headaches became intolerable, he agrees to Nuvigil combined with Lexapro and as needed melatonin off Remeron and Wellbutrin to regulate sleep and energy facilitating working through self-sustaining mood, sleep, and anxiety symptoms.  He is E scribed Nuvigil 150 mg every morning after breakfast sent as #30 with 1 refill replacing Wellbutrin to [facility] for dysthymia, circadian rhythm, and obstructive sleep apnea disorders.  He is E scribed to continue Lexapro 20 mg every bedtime sent as #30 with 1 refill to [facility] having replaced Celexa for anxiety and depression.  He continues his CPAP and OTC melatonin 10 mg nightly when needed.  Returns for follow-up in 8 weeks or sooner if needed  Shaking the morning.   Chauncey Mann, MD

## 2020-04-30 ENCOUNTER — Ambulatory Visit: Payer: 59 | Admitting: Psychiatry

## 2020-04-30 ENCOUNTER — Ambulatory Visit (INDEPENDENT_AMBULATORY_CARE_PROVIDER_SITE_OTHER): Payer: 59 | Admitting: Psychiatry

## 2020-04-30 ENCOUNTER — Other Ambulatory Visit: Payer: Self-pay

## 2020-04-30 ENCOUNTER — Encounter: Payer: Self-pay | Admitting: Psychiatry

## 2020-04-30 VITALS — BP 126/82 | HR 80 | Ht 69.0 in | Wt 205.0 lb

## 2020-04-30 DIAGNOSIS — F341 Dysthymic disorder: Secondary | ICD-10-CM

## 2020-04-30 DIAGNOSIS — F411 Generalized anxiety disorder: Secondary | ICD-10-CM | POA: Diagnosis not present

## 2020-04-30 DIAGNOSIS — G4720 Circadian rhythm sleep disorder, unspecified type: Secondary | ICD-10-CM

## 2020-04-30 DIAGNOSIS — G4733 Obstructive sleep apnea (adult) (pediatric): Secondary | ICD-10-CM | POA: Diagnosis not present

## 2020-04-30 MED ORDER — BUPROPION HCL ER (XL) 150 MG PO TB24: 150.0000 mg | ORAL_TABLET | Freq: Every day | ORAL | 5 refills | Status: DC

## 2020-04-30 MED ORDER — ESCITALOPRAM OXALATE 20 MG PO TABS: 20.0000 mg | ORAL_TABLET | Freq: Every day | ORAL | 5 refills | Status: DC

## 2020-04-30 NOTE — Progress Notes (Signed)
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  Patient has had difficulty discerning benefits of medications, CPAP, and his own behavioral sleep hygiene interventions.  However, over the course of time, he is more confident that the Wellbutrin was not the sole or significant cause of prior headache according to its disocntinuation, though headaches have gradually improved as he is getting back to being himself again on the Lexapro replacing Celexa and Remeron.  The Nuvigil did not provide any benefit through the day at 150 mg.  He has tried the Lexapro morning and bedtime and not certain there is any real difference but is now pleased with the Lexapro efficacy over the previous Celexa.  He would appreciate restarting the activating Wellbutrin preferring Wellbutrin as the 150 XL as before, though if he gets some morning headaches he might accept the Wellbutrin 100 mg SR in the morning for benefits mainly through the day.  His wife is encouraging him to try instead of True Focus herbal preparation which helps her with phenylalanine and tyrosine.  Patient explores all these issues feeling better overall planning for his next 6 months finding blood pressure okay today checking it much less at home.  Only headache now is a stress headache over the occiput from the neck and this is only occasionally, only concerned about past waking with  headaches.  He has no mania, suicidality, psychosis or delirium.  Anxiety Presents forfollow-upvisit. Symptoms includedecreased concentration,excessive worry,insomnia,fatigue,nervous/anxious behavior, low motivation, decreased interest, and reactive sadness.Symptoms do not include panic, nausea, dissociation,  phobic avoidance,restlessness,compulsions,obsessions,irritability,malaise,muscle tension,guilty self-deprecation, low energy, and apathetic motivationor suicidal ideas. Symptoms occurmost days. The severity of symptoms ismoderate, causing significant distress and interfering with daily activities. The quality of sleep isnon-restorative. Nighttime awakenings:several.  Individual Medical History/ Review of Systems: Changes? :Yes weight is down 6 pounds in 2 months  Allergies: Patient has no known allergies.  Current Medications:  Current Outpatient Medications:  .  buPROPion (WELLBUTRIN XL) 150 MG 24 hr tablet, Take 1 tablet (150 mg total) by mouth daily after breakfast., Disp: 30 tablet, Rfl: 5 .  escitalopram (LEXAPRO) 20 MG tablet, Take 1 tablet (20 mg total) by mouth at bedtime., Disp: 30 tablet, Rfl: 5 .  HYDROcodone-acetaminophen (NORCO/VICODIN) 5-325 MG per tablet, Take 2 tablets by mouth every 4 (four) hours as needed. (Patient not taking: Reported on 08/02/2018), Disp: 12 tablet, Rfl: 0 .  ibuprofen (ADVIL,MOTRIN) 200 MG tablet, Take 600 mg by mouth every 6 (six) hours as needed (For pain). , Disp: , Rfl:  .  lovastatin (MEVACOR) 20 MG tablet, Take 20 mg by mouth at bedtime., Disp: , Rfl:  .  Omega-3 Fatty Acids (FISH OIL) 300 MG CAPS, Take 300 mg by mouth daily., Disp: , Rfl:  .  omeprazole (PRILOSEC) 40 MG capsule, Take 40 mg by mouth daily before breakfast., Disp: , Rfl:  .  ondansetron (ZOFRAN ODT) 4 MG disintegrating tablet, Take 1 tablet (4 mg total) by mouth every 8 (eight) hours  as needed for nausea or vomiting. (Patient not taking: Reported on  08/02/2018), Disp: 10 tablet, Rfl: 0  Medication Side Effects: none  Family Medical/ Social History: Changes? No  MENTAL HEALTH EXAM:  Blood pressure 126/82, pulse 80, height 5\' 9"  (1.753 m), weight 205 lb (93 kg).Body mass index is 30.27 kg/m.  General Appearance: Casual, Fairly Groomed, Meticulous and Obese  Eye Contact:  Good  Speech:  Clear and Coherent, Normal Rate and Talkative  Volume:  Normal  Mood:  Anxious, Dysphoric and Euthymic  Affect:  Congruent, Inappropriate, Full Range and Anxious  Thought Process:  Coherent, Goal Directed and Descriptions of Associations: Circumstantial  Orientation:  Full (Time, Place, and Person)  Thought Content: Obsessions and Rumination   Suicidal Thoughts:  No  Homicidal Thoughts:  No  Memory:  Immediate;   Good Remote;   Good  Judgement:  Good  Insight:  Fair  Psychomotor Activity:  Normal and Mannerisms  Concentration:  Concentration: Fair and Attention Span: Good  Recall:  Fair  Fund of Knowledge: Good  Language: Good  Assets:  Desire for Improvement Intimacy Resilience Talents/Skills  ADL's:  Intact  Cognition: WNL  Prognosis:  Good    DIAGNOSES:    ICD-10-CM   1. Persistent depressive disorder with melancholic features, currently mild  F34.1 escitalopram (LEXAPRO) 20 MG tablet    buPROPion (WELLBUTRIN XL) 150 MG 24 hr tablet  2. Generalized anxiety disorder  F41.1 escitalopram (LEXAPRO) 20 MG tablet    buPROPion (WELLBUTRIN XL) 150 MG 24 hr tablet  3. OBSTRUCTIVE SLEEP APNEA  G47.33     Receiving Psychotherapy: No    RECOMMENDATIONS: Lexapro will continue 20 mg every bedtime though he may find that morning administration is better all around as he continues to compare such.  He will restart the Wellbutrin 150 mg XL every morning discontinuing the Nuvigil 150 mg not likely a circadian problem, though to consider the Wellbutrin 100 mg SR in the morning instead if he has headaches come back in the morning with the 24-hour  Wellbutrin preparation.  Otherwise he expects this to meet needs for next 4 to 6 months unless to try instead OTC True Focus that wife recommends and takes herself.  He continue CPAP adjusting as tolerated for sleep regulation without snoring.  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  [place] registry documents only the armodafinil filling and refilling 04/23/2020.  He returns in 3 to 6 months or sooner if needed.  Lexapro is E scribed 20 mg every bedtime #30 with 5 refills and Wellbutrin is E scribed 150 mg XL every morning #30 with 5 refills to [facility] 06/23/2020 for persistent depression with melancholic features, generalized anxiety, and obstructive sleep apnea.  He plans follow-up in 6 months or sooner if needed.   [employer], MD

## 2020-05-24 ENCOUNTER — Other Ambulatory Visit: Payer: Self-pay | Admitting: Psychiatry

## 2020-05-24 DIAGNOSIS — G4720 Circadian rhythm sleep disorder, unspecified type: Secondary | ICD-10-CM

## 2020-05-24 DIAGNOSIS — G4733 Obstructive sleep apnea (adult) (pediatric): Secondary | ICD-10-CM

## 2020-07-04 ENCOUNTER — Encounter: Payer: Self-pay | Admitting: Psychiatry

## 2020-10-30 ENCOUNTER — Other Ambulatory Visit: Payer: Self-pay

## 2020-10-30 ENCOUNTER — Ambulatory Visit (INDEPENDENT_AMBULATORY_CARE_PROVIDER_SITE_OTHER): Payer: 59 | Admitting: Adult Health

## 2020-10-30 ENCOUNTER — Encounter: Payer: Self-pay | Admitting: Adult Health

## 2020-10-30 ENCOUNTER — Ambulatory Visit: Payer: 59 | Admitting: Psychiatry

## 2020-10-30 DIAGNOSIS — F341 Dysthymic disorder: Secondary | ICD-10-CM | POA: Diagnosis not present

## 2020-10-30 DIAGNOSIS — F411 Generalized anxiety disorder: Secondary | ICD-10-CM | POA: Diagnosis not present

## 2020-10-30 MED ORDER — ESCITALOPRAM OXALATE 20 MG PO TABS: 20.0000 mg | ORAL_TABLET | Freq: Every day | ORAL | 3 refills | Status: DC

## 2020-10-30 NOTE — Progress Notes (Signed)
Ryan Welch [id number] 08-04-1971 50 y.o.  Subjective:   Patient ID:  Ryan Welch is a 50 y.o. (DOB 1970/09/15) male.  Chief Complaint: No chief complaint on file.   HPI Ryan Welch presents to the office today for follow-up of MDD and GAD.   Describes mood today as "ok". Pleasant. Mood symptoms - denies depression, anxiety, and irritability. Stating "I don't have any issues". Family doing well. Stable interest and motivation. Taking medications as prescribed.  Energy levels stable. Active, exercises some days.   Enjoys some usual interests and activities. Married x 24 years - 2 children 21 and 74. Spending time with family. Appetite adequate. Weight stable - 212 pounds.. Sleeps well most nights. Averages 8 hours. Using CPAP machine. Focus and concentration stable. Completing tasks. Managing aspects of household. Works full time for [employer]. Denies SI or HI.  Denies AH or VH.  Previous medication trials: Wellbutrin  Review of Systems:  Review of Systems  Musculoskeletal: Negative for gait problem.  Neurological: Negative for tremors.  Psychiatric/Behavioral:       Please refer to HPI    Medications: I have reviewed the patient's current medications.  Current Outpatient Medications  Medication Sig Dispense Refill  . escitalopram (LEXAPRO) 20 MG tablet Take 1 tablet (20 mg total) by mouth at bedtime. 90 tablet 3  . HYDROcodone-acetaminophen (NORCO/VICODIN) 5-325 MG per tablet Take 2 tablets by mouth every 4 (four) hours as needed. (Patient not taking: Reported on 08/02/2018) 12 tablet 0  . ibuprofen (ADVIL,MOTRIN) 200 MG tablet Take 600 mg by mouth every 6 (six) hours as needed (For pain).     Marland Kitchen lovastatin (MEVACOR) 20 MG tablet Take 20 mg by mouth at bedtime.    . Omega-3 Fatty Acids (FISH OIL) 300 MG CAPS Take 300 mg by mouth daily.    Marland Kitchen omeprazole (PRILOSEC) 40 MG capsule Take 40 mg by mouth daily before breakfast.    . ondansetron (ZOFRAN ODT) 4  MG disintegrating tablet Take 1 tablet (4 mg total) by mouth every 8 (eight) hours as needed for nausea or vomiting. (Patient not taking: Reported on 08/02/2018) 10 tablet 0   No current facility-administered medications for this visit.    Medication Side Effects: None  Allergies:  Allergies  Allergen Reactions  . Atorvastatin     REACTION: myalgias Other reaction(s): jt pain    Past Medical History:  Diagnosis Date  . Allergic rhinitis   . Anxiety   . GERD (gastroesophageal reflux disease)   . Hyperlipidemia   . Renal disorder   . Sleep apnea     Family History  Problem Relation Age of Onset  . GER disease Father   . Coronary artery disease Father     Social History   Socioeconomic History  . Marital status: Married    Spouse name: Not on file  . Number of children: 1  . Years of education: Not on file  . Highest education level: Not on file  Occupational History  . Occupation: Psychologist, sport and exercise: UNEMPLOYED  Tobacco Use  . Smoking status: Never Smoker  . Smokeless tobacco: Never Used  Vaping Use  . Vaping Use: Never used  Substance and Sexual Activity  . Alcohol use: No  . Drug use: No  . Sexual activity: Yes  Other Topics Concern  . Not on file  Social History Narrative  . Not on file   Social Determinants of Health   Financial Resource Strain: Not  on file  Food Insecurity: Not on file  Transportation Needs: Not on file  Physical Activity: Not on file  Stress: Not on file  Social Connections: Not on file  Intimate Partner Violence: Not on file    Past Medical History, Surgical history, Social history, and Family history were reviewed and updated as appropriate.   Please see review of systems for further details on the patient's review from today.   Objective:   Physical Exam:  There were no vitals taken for this visit.  Physical Exam Constitutional:      General: He is not in acute distress. Musculoskeletal:         General: No deformity.  Neurological:     Mental Status: He is alert and oriented to person, place, and time.     Coordination: Coordination normal.  Psychiatric:        Attention and Perception: Attention and perception normal. He does not perceive auditory or visual hallucinations.        Mood and Affect: Mood normal. Mood is not anxious or depressed. Affect is not labile, blunt, angry or inappropriate.        Speech: Speech normal.        Behavior: Behavior normal.        Thought Content: Thought content normal. Thought content is not paranoid or delusional. Thought content does not include homicidal or suicidal ideation. Thought content does not include homicidal or suicidal plan.        Cognition and Memory: Cognition and memory normal.        Judgment: Judgment normal.     Comments: Insight intact     Lab Review:     Component Value Date/Time   NA 139 01/08/2014 0900   K 4.1 01/08/2014 0900   CL 97 01/08/2014 0900   CO2 26 01/08/2014 0900   GLUCOSE 106 (H) 01/08/2014 0900   BUN 17 01/08/2014 0900   CREATININE 1.13 01/08/2014 0900   CALCIUM 9.7 01/08/2014 0900   PROT 7.6 01/08/2014 0900   ALBUMIN 4.5 01/08/2014 0900   AST 26 01/08/2014 0900   ALT 26 01/08/2014 0900   ALKPHOS 59 01/08/2014 0900   BILITOT 0.4 01/08/2014 0900   GFRNONAA 80 (L) 01/08/2014 0900   GFRAA >90 01/08/2014 0900       Component Value Date/Time   WBC 6.4 01/08/2014 0900   RBC 5.34 01/08/2014 0900   HGB 15.3 01/08/2014 0900   HCT 44.2 01/08/2014 0900   PLT 279 01/08/2014 0900   MCV 82.8 01/08/2014 0900   MCH 28.7 01/08/2014 0900   MCHC 34.6 01/08/2014 0900   RDW 12.5 01/08/2014 0900   LYMPHSABS 2.2 01/08/2014 0900   MONOABS 0.6 01/08/2014 0900   EOSABS 0.1 01/08/2014 0900   BASOSABS 0.0 01/08/2014 0900    No results found for: POCLITH, LITHIUM   No results found for: PHENYTOIN, PHENOBARB, VALPROATE, CBMZ   .res Assessment: Plan:    Plan:  PDMP reviewed  1. Lexapro 20mg   daily  Read and reviewed note with patient for accuracy.   RTC 1 year  Patient advised to contact office with any questions, adverse effects, or acute worsening in signs and symptoms.     Diagnoses and all orders for this visit:  Persistent depressive disorder with melancholic features, currently mild -     escitalopram (LEXAPRO) 20 MG tablet; Take 1 tablet (20 mg total) by mouth at bedtime.  Generalized anxiety disorder -     escitalopram (LEXAPRO) 20 MG  tablet; Take 1 tablet (20 mg total) by mouth at bedtime.     Please see After Visit Summary for patient specific instructions.  Future Appointments  Date Time Provider Department Center  10/30/2021  1:00 PM Maheen Cwikla, Thereasa Solo, NP [facility] None    No orders of the defined types were placed in this encounter.   -------------------------------

## 2020-11-29 ENCOUNTER — Other Ambulatory Visit: Payer: Self-pay | Admitting: Psychiatry

## 2020-11-29 DIAGNOSIS — F341 Dysthymic disorder: Secondary | ICD-10-CM

## 2020-11-29 DIAGNOSIS — F411 Generalized anxiety disorder: Secondary | ICD-10-CM

## 2021-03-24 ENCOUNTER — Encounter: Payer: Self-pay | Admitting: Adult Health

## 2021-03-24 ENCOUNTER — Ambulatory Visit: Payer: 59 | Admitting: Adult Health

## 2021-03-24 ENCOUNTER — Other Ambulatory Visit: Payer: Self-pay

## 2021-03-24 DIAGNOSIS — F411 Generalized anxiety disorder: Secondary | ICD-10-CM | POA: Diagnosis not present

## 2021-03-24 DIAGNOSIS — F341 Dysthymic disorder: Secondary | ICD-10-CM

## 2021-03-24 MED ORDER — BUPROPION HCL ER (XL) 150 MG PO TB24: ORAL_TABLET | ORAL | 5 refills | Status: DC

## 2021-03-24 NOTE — Progress Notes (Signed)
[name] [id number] [birth year] 50 y.o.  Subjective:   Patient ID:  Ryan Welch is a 50 y.o. (DOB 1970/12/22) male.  Chief Complaint: No chief complaint on file.   HPI Ryan Welch presents to the office today for follow-up of MDD and GAD.   Describes mood today as "a little lower". Pleasant. Mood symptoms - depression - "not sad, but not able to enjoy things". Has been a little more irritable. Denies anxiety. Feels tired and fatigued - "fighting it for a few years". Symptoms worse in the afternoons. Stating "I'm not doing as good as it was". Family doing well. Decreased interest and motivation. Taking medications as prescribed.  Energy levels lower. Active, exercises some days.   Enjoys some usual interests and activities. Married x 24 years - 2 children 21 and 12. Spending time with family. Appetite adequate. Weight stable - 212 pounds.. Sleeps well most nights. Averages 8 hours. Using CPAP machine. Focus and concentration stable. Completing tasks. Managing aspects of household. Works full time for [employer]. Denies SI or HI.  Denies AH or VH.  Previous medication trials: Wellbutrin  Review of Systems:  Review of Systems  Musculoskeletal:  Negative for gait problem.  Neurological:  Negative for tremors.  Psychiatric/Behavioral:         Please refer to HPI   Medications: I have reviewed the patient's current medications.  Current Outpatient Medications  Medication Sig Dispense Refill   buPROPion (WELLBUTRIN XL) 150 MG 24 hr tablet Take one tablet every morning for 7 days, then take two tablets every morning. 60 tablet 5   escitalopram (LEXAPRO) 20 MG tablet Take 1 tablet (20 mg total) by mouth at bedtime. 90 tablet 3   HYDROcodone-acetaminophen (NORCO/VICODIN) 5-325 MG per tablet Take 2 tablets by mouth every 4 (four) hours as needed. (Patient not taking: Reported on 08/02/2018) 12 tablet 0   ibuprofen (ADVIL,MOTRIN) 200 MG tablet Take 600 mg by mouth  every 6 (six) hours as needed (For pain).      lovastatin (MEVACOR) 20 MG tablet Take 20 mg by mouth at bedtime.     Omega-3 Fatty Acids (FISH OIL) 300 MG CAPS Take 300 mg by mouth daily.     omeprazole (PRILOSEC) 40 MG capsule Take 40 mg by mouth daily before breakfast.     ondansetron (ZOFRAN ODT) 4 MG disintegrating tablet Take 1 tablet (4 mg total) by mouth every 8 (eight) hours as needed for nausea or vomiting. (Patient not taking: Reported on 08/02/2018) 10 tablet 0   No current facility-administered medications for this visit.    Medication Side Effects: None  Allergies:  Allergies  Allergen Reactions   Atorvastatin     REACTION: myalgias Other reaction(s): jt pain    Past Medical History:  Diagnosis Date   Allergic rhinitis    Anxiety    GERD (gastroesophageal reflux disease)    Hyperlipidemia    Renal disorder    Sleep apnea     Past Medical History, Surgical history, Social history, and Family history were reviewed and updated as appropriate.   Please see review of systems for further details on the patient's review from today.   Objective:   Physical Exam:  There were no vitals taken for this visit.  Physical Exam Constitutional:      General: He is not in acute distress. Musculoskeletal:        General: No deformity.  Neurological:     Mental Status: He is alert and oriented  to person, place, and time.     Coordination: Coordination normal.  Psychiatric:        Attention and Perception: Attention and perception normal. He does not perceive auditory or visual hallucinations.        Mood and Affect: Mood normal. Mood is not anxious or depressed. Affect is not labile, blunt, angry or inappropriate.        Speech: Speech normal.        Behavior: Behavior normal.        Thought Content: Thought content normal. Thought content is not paranoid or delusional. Thought content does not include homicidal or suicidal ideation. Thought content does not include  homicidal or suicidal plan.        Cognition and Memory: Cognition and memory normal.        Judgment: Judgment normal.     Comments: Insight intact    Lab Review:     Component Value Date/Time   NA 139 01/08/2014 0900   K 4.1 01/08/2014 0900   CL 97 01/08/2014 0900   CO2 26 01/08/2014 0900   GLUCOSE 106 (H) 01/08/2014 0900   BUN 17 01/08/2014 0900   CREATININE 1.13 01/08/2014 0900   CALCIUM 9.7 01/08/2014 0900   PROT 7.6 01/08/2014 0900   ALBUMIN 4.5 01/08/2014 0900   AST 26 01/08/2014 0900   ALT 26 01/08/2014 0900   ALKPHOS 59 01/08/2014 0900   BILITOT 0.4 01/08/2014 0900   GFRNONAA 80 (L) 01/08/2014 0900   GFRAA >90 01/08/2014 0900       Component Value Date/Time   WBC 6.4 01/08/2014 0900   RBC 5.34 01/08/2014 0900   HGB 15.3 01/08/2014 0900   HCT 44.2 01/08/2014 0900   PLT 279 01/08/2014 0900   MCV 82.8 01/08/2014 0900   MCH 28.7 01/08/2014 0900   MCHC 34.6 01/08/2014 0900   RDW 12.5 01/08/2014 0900   LYMPHSABS 2.2 01/08/2014 0900   MONOABS 0.6 01/08/2014 0900   EOSABS 0.1 01/08/2014 0900   BASOSABS 0.0 01/08/2014 0900    No results found for: POCLITH, LITHIUM   No results found for: PHENYTOIN, PHENOBARB, VALPROATE, CBMZ   .res Assessment: Plan:   Plan:  PDMP reviewed  1. Lexapro 20mg  daily 2. Add Wellbutrin XL 150mg  every morning x 7 days, then increase to 300g daily - denies seizure history.  RTC 1 year  Patient advised to contact office with any questions, adverse effects, or acute worsening in signs and symptoms. Diagnoses and all orders for this visit:  Persistent depressive disorder with melancholic features, currently mild -     buPROPion (WELLBUTRIN XL) 150 MG 24 hr tablet; Take one tablet every morning for 7 days, then take two tablets every morning.  Generalized anxiety disorder    Please see After Visit Summary for patient specific instructions.  Future Appointments  Date Time Provider Department Center  10/30/2021  1:00 PM  Angelia Hazell, , NP [facility] None    No orders of the defined types were placed in this encounter.   -------------------------------

## 2021-04-21 ENCOUNTER — Ambulatory Visit: Payer: 59 | Admitting: Adult Health

## 2021-04-21 ENCOUNTER — Other Ambulatory Visit: Payer: Self-pay

## 2021-04-21 ENCOUNTER — Encounter: Payer: Self-pay | Admitting: Adult Health

## 2021-04-21 DIAGNOSIS — F411 Generalized anxiety disorder: Secondary | ICD-10-CM | POA: Diagnosis not present

## 2021-04-21 DIAGNOSIS — F341 Dysthymic disorder: Secondary | ICD-10-CM | POA: Diagnosis not present

## 2021-04-21 MED ORDER — BUPROPION HCL ER (XL) 150 MG PO TB24: ORAL_TABLET | ORAL | 5 refills | Status: DC

## 2021-04-21 NOTE — Progress Notes (Signed)
Ryan Welch [id number] [birth year] 50 y.o.  Subjective:   Patient ID:  Ryan Welch is a 50 y.o. (DOB 1971-03-27) male.  Chief Complaint: No chief complaint on file.   HPI Ryan Welch presents to the office today for follow-up of MDD and GAD.   Describes mood today as "better". Pleasant. Mood symptoms - denies depression - "gets tired in the afternoon". Not getting as irritated - "more patient". Denies anxiety. Feels tired and fatigued - "about the same". "Stating "I feel better than I have in a while, but not quite there yet". Family doing well. Decreased interest and motivation. Taking medications as prescribed.  Energy levels lower. Active, exercises some days.   Enjoys some usual interests and activities. Married x 24 years - 2 children 21 and 67. Spending time with family. Appetite adequate. Weight stable - 212 pounds.. Sleeps well most nights. Averages 8 hours. Using CPAP machine. Focus and concentration stable. Completing tasks. Managing aspects of household. Works full time for [employer]. Denies SI or HI.  Denies AH or VH.  Previous medication trials: Wellbutrin  Review of Systems:  Review of Systems  Musculoskeletal:  Negative for gait problem.  Neurological:  Negative for tremors.  Psychiatric/Behavioral:         Please refer to HPI   Medications: I have reviewed the patient's current medications.  Current Outpatient Medications  Medication Sig Dispense Refill   buPROPion (WELLBUTRIN XL) 150 MG 24 hr tablet Take three tablets every morning. 90 tablet 5   escitalopram (LEXAPRO) 20 MG tablet Take 1 tablet (20 mg total) by mouth at bedtime. 90 tablet 3   HYDROcodone-acetaminophen (NORCO/VICODIN) 5-325 MG per tablet Take 2 tablets by mouth every 4 (four) hours as needed. (Patient not taking: Reported on 08/02/2018) 12 tablet 0   ibuprofen (ADVIL,MOTRIN) 200 MG tablet Take 600 mg by mouth every 6 (six) hours as needed (For pain).      lovastatin  (MEVACOR) 20 MG tablet Take 20 mg by mouth at bedtime.     Omega-3 Fatty Acids (FISH OIL) 300 MG CAPS Take 300 mg by mouth daily.     omeprazole (PRILOSEC) 40 MG capsule Take 40 mg by mouth daily before breakfast.     ondansetron (ZOFRAN ODT) 4 MG disintegrating tablet Take 1 tablet (4 mg total) by mouth every 8 (eight) hours as needed for nausea or vomiting. (Patient not taking: Reported on 08/02/2018) 10 tablet 0   No current facility-administered medications for this visit.    Medication Side Effects: None  Allergies:  Allergies  Allergen Reactions   Atorvastatin     REACTION: myalgias Other reaction(s): jt pain    Past Medical History:  Diagnosis Date   Allergic rhinitis    Anxiety    GERD (gastroesophageal reflux disease)    Hyperlipidemia    Renal disorder    Sleep apnea     Past Medical History, Surgical history, Social history, and Family history were reviewed and updated as appropriate.   Please see review of systems for further details on the patient's review from today.   Objective:   Physical Exam:  There were no vitals taken for this visit.  Physical Exam Constitutional:      General: He is not in acute distress. Musculoskeletal:        General: No deformity.  Neurological:     Mental Status: He is alert and oriented to person, place, and time.     Coordination: Coordination normal.  Psychiatric:  Attention and Perception: Attention and perception normal. He does not perceive auditory or visual hallucinations.        Mood and Affect: Mood normal. Mood is not anxious or depressed. Affect is not labile, blunt, angry or inappropriate.        Speech: Speech normal.        Behavior: Behavior normal.        Thought Content: Thought content normal. Thought content is not paranoid or delusional. Thought content does not include homicidal or suicidal ideation. Thought content does not include homicidal or suicidal plan.        Cognition and Memory:  Cognition and memory normal.        Judgment: Judgment normal.     Comments: Insight intact    Lab Review:     Component Value Date/Time   NA 139 01/08/2014 0900   K 4.1 01/08/2014 0900   CL 97 01/08/2014 0900   CO2 26 01/08/2014 0900   GLUCOSE 106 (H) 01/08/2014 0900   BUN 17 01/08/2014 0900   CREATININE 1.13 01/08/2014 0900   CALCIUM 9.7 01/08/2014 0900   PROT 7.6 01/08/2014 0900   ALBUMIN 4.5 01/08/2014 0900   AST 26 01/08/2014 0900   ALT 26 01/08/2014 0900   ALKPHOS 59 01/08/2014 0900   BILITOT 0.4 01/08/2014 0900   GFRNONAA 80 (L) 01/08/2014 0900   GFRAA >90 01/08/2014 0900       Component Value Date/Time   WBC 6.4 01/08/2014 0900   RBC 5.34 01/08/2014 0900   HGB 15.3 01/08/2014 0900   HCT 44.2 01/08/2014 0900   PLT 279 01/08/2014 0900   MCV 82.8 01/08/2014 0900   MCH 28.7 01/08/2014 0900   MCHC 34.6 01/08/2014 0900   RDW 12.5 01/08/2014 0900   LYMPHSABS 2.2 01/08/2014 0900   MONOABS 0.6 01/08/2014 0900   EOSABS 0.1 01/08/2014 0900   BASOSABS 0.0 01/08/2014 0900    No results found for: POCLITH, LITHIUM   No results found for: PHENYTOIN, PHENOBARB, VALPROATE, CBMZ   .res Assessment: Plan:    Plan:  PDMP reviewed  1. Lexapro 20mg  daily 2. Increase Wellbutrin XL 300mg  to 450mg  daily - denies seizure history.  Patient advised to contact office with any questions, adverse effects, or acute worsening in signs and symptoms.  Diagnoses and all orders for this visit:  Persistent depressive disorder with melancholic features, currently mild -     buPROPion (WELLBUTRIN XL) 150 MG 24 hr tablet; Take three tablets every morning.    Please see After Visit Summary for patient specific instructions.  Future Appointments  Date Time Provider Department Center  10/30/2021  1:00 PM Sophia Sperry, , NP CP-CP None    No orders of the defined types were placed in this encounter.   -------------------------------

## 2021-05-20 ENCOUNTER — Ambulatory Visit: Payer: 59 | Admitting: Adult Health

## 2021-05-20 ENCOUNTER — Encounter: Payer: Self-pay | Admitting: Adult Health

## 2021-05-20 ENCOUNTER — Other Ambulatory Visit: Payer: Self-pay

## 2021-05-20 DIAGNOSIS — F411 Generalized anxiety disorder: Secondary | ICD-10-CM | POA: Diagnosis not present

## 2021-05-20 DIAGNOSIS — F341 Dysthymic disorder: Secondary | ICD-10-CM | POA: Diagnosis not present

## 2021-05-20 NOTE — Progress Notes (Signed)
TOA MIA [id number] 1970-11-27 50 y.o.  Subjective:   Patient ID:  Ryan Welch is a 50 y.o. (DOB [birth year]) male.  Chief Complaint: No chief complaint on file.   HPI Ryan Welch presents to the office today for follow-up of MDD and GAD.   Describes mood today as "better". Pleasant. Mood symptoms - denies depression, anxiety, or irritability. Stating "I feel better". No longer feeling "tired". Feels like the Wellbutrin and Lexapro are working well. Family doing well. Recent trip to [place]. Improved interest and motivation. Taking medications as prescribed.  Energy levels improved. Active, exercises some days.   Enjoys some usual interests and activities. Married x 24 years - 2 children 21 and 37. Spending time with family. Appetite adequate. Weight stable - 212 pounds.. Sleeps well most nights. Averages 8 hours. Using CPAP machine. Focus and concentration stable. Completing tasks. Managing aspects of household. Works full time for [employer]. Denies SI or HI.  Denies AH or VH.  Previous medication trials: Wellbutrin   Review of Systems:  Review of Systems  Musculoskeletal:  Negative for gait problem.  Neurological:  Negative for tremors.  Psychiatric/Behavioral:         Please refer to HPI   Medications: I have reviewed the patient's current medications.  Current Outpatient Medications  Medication Sig Dispense Refill   buPROPion (WELLBUTRIN XL) 150 MG 24 hr tablet Take three tablets every morning. 90 tablet 5   escitalopram (LEXAPRO) 20 MG tablet Take 1 tablet (20 mg total) by mouth at bedtime. 90 tablet 3   HYDROcodone-acetaminophen (NORCO/VICODIN) 5-325 MG per tablet Take 2 tablets by mouth every 4 (four) hours as needed. (Patient not taking: Reported on 08/02/2018) 12 tablet 0   ibuprofen (ADVIL,MOTRIN) 200 MG tablet Take 600 mg by mouth every 6 (six) hours as needed (For pain).      lovastatin (MEVACOR) 20 MG tablet Take 20 mg by mouth at  bedtime.     Omega-3 Fatty Acids (FISH OIL) 300 MG CAPS Take 300 mg by mouth daily.     omeprazole (PRILOSEC) 40 MG capsule Take 40 mg by mouth daily before breakfast.     ondansetron (ZOFRAN ODT) 4 MG disintegrating tablet Take 1 tablet (4 mg total) by mouth every 8 (eight) hours as needed for nausea or vomiting. (Patient not taking: Reported on 08/02/2018) 10 tablet 0   No current facility-administered medications for this visit.    Medication Side Effects: None  Allergies:  Allergies  Allergen Reactions   Atorvastatin     REACTION: myalgias Other reaction(s): jt pain    Past Medical History:  Diagnosis Date   Allergic rhinitis    Anxiety    GERD (gastroesophageal reflux disease)    Hyperlipidemia    Renal disorder    Sleep apnea     Past Medical History, Surgical history, Social history, and Family history were reviewed and updated as appropriate.   Please see review of systems for further details on the patient's review from today.   Objective:   Physical Exam:  There were no vitals taken for this visit.  Physical Exam Constitutional:      General: He is not in acute distress. Musculoskeletal:        General: No deformity.  Neurological:     Mental Status: He is alert and oriented to person, place, and time.     Coordination: Coordination normal.  Psychiatric:        Attention and Perception: Attention and perception  normal. He does not perceive auditory or visual hallucinations.        Mood and Affect: Mood normal. Mood is not anxious or depressed. Affect is not labile, blunt, angry or inappropriate.        Speech: Speech normal.        Behavior: Behavior normal.        Thought Content: Thought content normal. Thought content is not paranoid or delusional. Thought content does not include homicidal or suicidal ideation. Thought content does not include homicidal or suicidal plan.        Cognition and Memory: Cognition and memory normal.        Judgment:  Judgment normal.     Comments: Insight intact    Lab Review:     Component Value Date/Time   NA 139 01/08/2014 0900   K 4.1 01/08/2014 0900   CL 97 01/08/2014 0900   CO2 26 01/08/2014 0900   GLUCOSE 106 (H) 01/08/2014 0900   BUN 17 01/08/2014 0900   CREATININE 1.13 01/08/2014 0900   CALCIUM 9.7 01/08/2014 0900   PROT 7.6 01/08/2014 0900   ALBUMIN 4.5 01/08/2014 0900   AST 26 01/08/2014 0900   ALT 26 01/08/2014 0900   ALKPHOS 59 01/08/2014 0900   BILITOT 0.4 01/08/2014 0900   GFRNONAA 80 (L) 01/08/2014 0900   GFRAA >90 01/08/2014 0900       Component Value Date/Time   WBC 6.4 01/08/2014 0900   RBC 5.34 01/08/2014 0900   HGB 15.3 01/08/2014 0900   HCT 44.2 01/08/2014 0900   PLT 279 01/08/2014 0900   MCV 82.8 01/08/2014 0900   MCH 28.7 01/08/2014 0900   MCHC 34.6 01/08/2014 0900   RDW 12.5 01/08/2014 0900   LYMPHSABS 2.2 01/08/2014 0900   MONOABS 0.6 01/08/2014 0900   EOSABS 0.1 01/08/2014 0900   BASOSABS 0.0 01/08/2014 0900    No results found for: POCLITH, LITHIUM   No results found for: PHENYTOIN, PHENOBARB, VALPROATE, CBMZ   .res Assessment: Plan:    Plan:  PDMP reviewed  1. Lexapro 20mg  daily 2. Wellbutrin XL 450mg  daily - denies seizure history.  RTC 2 months  Patient advised to contact office with any questions, adverse effects, or acute worsening in signs and symptoms. Diagnoses and all orders for this visit:  Persistent depressive disorder with melancholic features, currently mild  Generalized anxiety disorder    Please see After Visit Summary for patient specific instructions.  Future Appointments  Date Time Provider Department Center  10/30/2021  1:00 PM Pryor Guettler, , NP CP-CP None    No orders of the defined types were placed in this encounter.   -------------------------------

## 2021-07-21 ENCOUNTER — Ambulatory Visit: Payer: 59 | Admitting: Adult Health

## 2021-07-21 ENCOUNTER — Encounter: Payer: Self-pay | Admitting: Adult Health

## 2021-07-21 ENCOUNTER — Other Ambulatory Visit: Payer: Self-pay

## 2021-07-21 ENCOUNTER — Other Ambulatory Visit: Payer: Self-pay | Admitting: Psychiatry

## 2021-07-21 DIAGNOSIS — F411 Generalized anxiety disorder: Secondary | ICD-10-CM

## 2021-07-21 DIAGNOSIS — F341 Dysthymic disorder: Secondary | ICD-10-CM

## 2021-07-21 MED ORDER — BUPROPION HCL ER (XL) 150 MG PO TB24: ORAL_TABLET | ORAL | 3 refills | Status: DC

## 2021-07-21 MED ORDER — ESCITALOPRAM OXALATE 20 MG PO TABS: 20.0000 mg | ORAL_TABLET | Freq: Every day | ORAL | 3 refills | Status: DC

## 2021-07-21 NOTE — Progress Notes (Signed)
Ryan Welch [id number] [birth year] 50 y.o.  Subjective:   Patient ID:  Ryan Welch is a 50 y.o. (DOB 1970-10-08) male.  Chief Complaint: No chief complaint on file.   HPI Ryan Welch presents to the office today for follow-up of MDD and GAD.  Describes mood today as "better". Pleasant. Mood symptoms - denies depression, anxiety, or irritability. Stating "I feel better than I have in a long time". Feels like the Wellbutrin and Lexapro are working well. Family doing well. Improved interest and motivation. Taking medications as prescribed.  Energy levels improved. Active, exercises some days.   Enjoys some usual interests and activities. Married x 24 years - 2 children 22 and 46. Spending time with family. Appetite adequate. Weight stable - 212 pounds.. Sleeps well most nights. Averages 8 hours. Using CPAP machine. Focus and concentration stable. Completing tasks. Managing aspects of household. Works full time for [employer]. Denies SI or HI.  Denies AH or VH.  Previous medication trials: Wellbutrin   Review of Systems:  Review of Systems  Musculoskeletal:  Negative for gait problem.  Neurological:  Negative for tremors.  Psychiatric/Behavioral:         Please refer to HPI   Medications: I have reviewed the patient's current medications.  Current Outpatient Medications  Medication Sig Dispense Refill   buPROPion (WELLBUTRIN XL) 150 MG 24 hr tablet Take three tablets every morning. 270 tablet 3   escitalopram (LEXAPRO) 20 MG tablet Take 1 tablet (20 mg total) by mouth at bedtime. 90 tablet 3   HYDROcodone-acetaminophen (NORCO/VICODIN) 5-325 MG per tablet Take 2 tablets by mouth every 4 (four) hours as needed. (Patient not taking: Reported on 08/02/2018) 12 tablet 0   ibuprofen (ADVIL,MOTRIN) 200 MG tablet Take 600 mg by mouth every 6 (six) hours as needed (For pain).      lovastatin (MEVACOR) 20 MG tablet Take 20 mg by mouth at bedtime.     Omega-3 Fatty Acids  (FISH OIL) 300 MG CAPS Take 300 mg by mouth daily.     omeprazole (PRILOSEC) 40 MG capsule Take 40 mg by mouth daily before breakfast.     ondansetron (ZOFRAN ODT) 4 MG disintegrating tablet Take 1 tablet (4 mg total) by mouth every 8 (eight) hours as needed for nausea or vomiting. (Patient not taking: Reported on 08/02/2018) 10 tablet 0   No current facility-administered medications for this visit.    Medication Side Effects: None  Allergies:  Allergies  Allergen Reactions   Atorvastatin     REACTION: myalgias Other reaction(s): jt pain    Past Medical History:  Diagnosis Date   Allergic rhinitis    Anxiety    GERD (gastroesophageal reflux disease)    Hyperlipidemia    Renal disorder    Sleep apnea     Past Medical History, Surgical history, Social history, and Family history were reviewed and updated as appropriate.   Please see review of systems for further details on the patient's review from today.   Objective:   Physical Exam:  There were no vitals taken for this visit.  Physical Exam Constitutional:      General: He is not in acute distress. Musculoskeletal:        General: No deformity.  Neurological:     Mental Status: He is alert and oriented to person, place, and time.     Coordination: Coordination normal.  Psychiatric:        Attention and Perception: Attention and perception normal. He  does not perceive auditory or visual hallucinations.        Mood and Affect: Mood normal. Mood is not anxious or depressed. Affect is not labile, blunt, angry or inappropriate.        Speech: Speech normal.        Behavior: Behavior normal.        Thought Content: Thought content normal. Thought content is not paranoid or delusional. Thought content does not include homicidal or suicidal ideation. Thought content does not include homicidal or suicidal plan.        Cognition and Memory: Cognition and memory normal.        Judgment: Judgment normal.     Comments: Insight  intact    Lab Review:     Component Value Date/Time   NA 139 01/08/2014 0900   K 4.1 01/08/2014 0900   CL 97 01/08/2014 0900   CO2 26 01/08/2014 0900   GLUCOSE 106 (H) 01/08/2014 0900   BUN 17 01/08/2014 0900   CREATININE 1.13 01/08/2014 0900   CALCIUM 9.7 01/08/2014 0900   PROT 7.6 01/08/2014 0900   ALBUMIN 4.5 01/08/2014 0900   AST 26 01/08/2014 0900   ALT 26 01/08/2014 0900   ALKPHOS 59 01/08/2014 0900   BILITOT 0.4 01/08/2014 0900   GFRNONAA 80 (L) 01/08/2014 0900   GFRAA >90 01/08/2014 0900       Component Value Date/Time   WBC 6.4 01/08/2014 0900   RBC 5.34 01/08/2014 0900   HGB 15.3 01/08/2014 0900   HCT 44.2 01/08/2014 0900   PLT 279 01/08/2014 0900   MCV 82.8 01/08/2014 0900   MCH 28.7 01/08/2014 0900   MCHC 34.6 01/08/2014 0900   RDW 12.5 01/08/2014 0900   LYMPHSABS 2.2 01/08/2014 0900   MONOABS 0.6 01/08/2014 0900   EOSABS 0.1 01/08/2014 0900   BASOSABS 0.0 01/08/2014 0900    No results found for: POCLITH, LITHIUM   No results found for: PHENYTOIN, PHENOBARB, VALPROATE, CBMZ   .res Assessment: Plan:    Plan:  PDMP reviewed  1. Lexapro 20mg  daily 2. Wellbutrin XL 450mg  daily - denies seizure history.  RTC 6 months  Patient advised to contact office with any questions, adverse effects, or acute worsening in signs and symptoms.  Diagnoses and all orders for this visit:  Persistent depressive disorder with melancholic features, currently mild -     escitalopram (LEXAPRO) 20 MG tablet; Take 1 tablet (20 mg total) by mouth at bedtime. -     buPROPion (WELLBUTRIN XL) 150 MG 24 hr tablet; Take three tablets every morning.  Generalized anxiety disorder -     escitalopram (LEXAPRO) 20 MG tablet; Take 1 tablet (20 mg total) by mouth at bedtime.    Please see After Visit Summary for patient specific instructions.  Future Appointments  Date Time Provider Department Center  01/19/2022  9:00 AM Syanna Remmert, , NP CP-CP None    No orders  of the defined types were placed in this encounter.   -------------------------------

## 2021-07-21 NOTE — Addendum Note (Signed)
Addended by: Karin Lieu T on: 07/21/2021 04:26 PM   Modules accepted: Orders

## 2021-07-21 NOTE — Telephone Encounter (Signed)
Patient was just seen today. His two prescriptions were sent to the wrong pharmacy. They were sent to:  [id number] [place], [place] - [address]  Phone:  [phone]  Fax:  [phone]    They should have been sent to:  [facility] - [place], [place] - [address]  Phone:  [phone]  Fax:  [phone]

## 2021-08-04 NOTE — Telephone Encounter (Signed)
Patient called in regarding previous message. He says he has checked the pharmacy and they have not received the prescription yet. Ph: [phone]. Appt 5/8. Lexapro 20mg  and Bupropion 150mg  sent to [facility] [address] [place]

## 2021-08-14 IMAGING — CR DG HIP (WITH OR WITHOUT PELVIS) 3-4V BILAT
3 series · 3 of 3 positions shown · non-contrast
Comparison: None.

CLINICAL DATA: Left hip pain.

EXAM:
DG HIP (WITH OR WITHOUT PELVIS) 3-4V BILAT

[t pelvis a.p.]
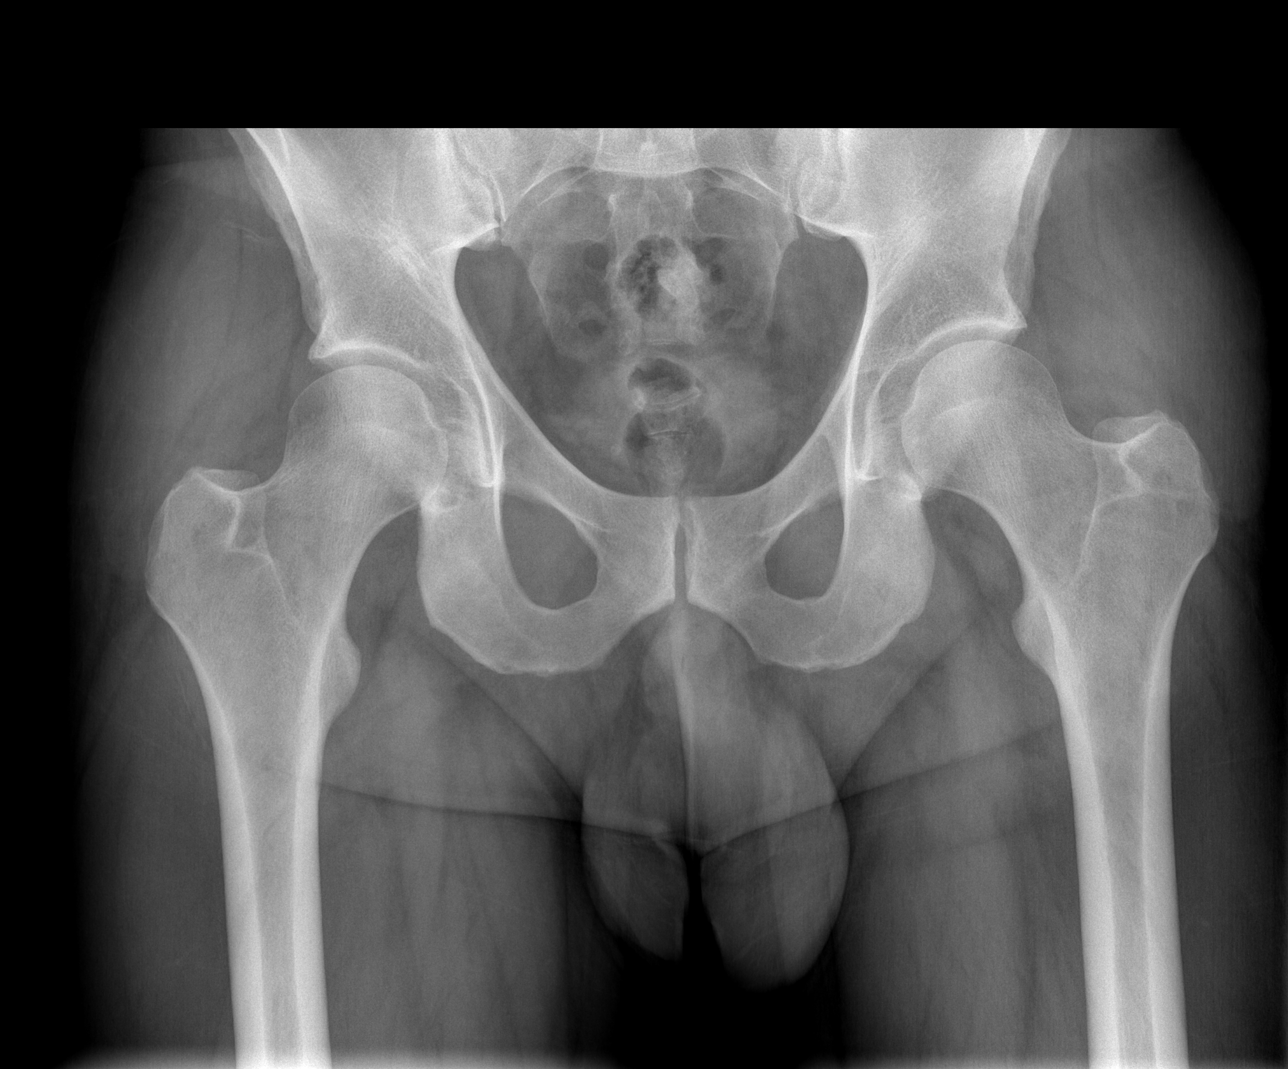

[t hip frog leg left]
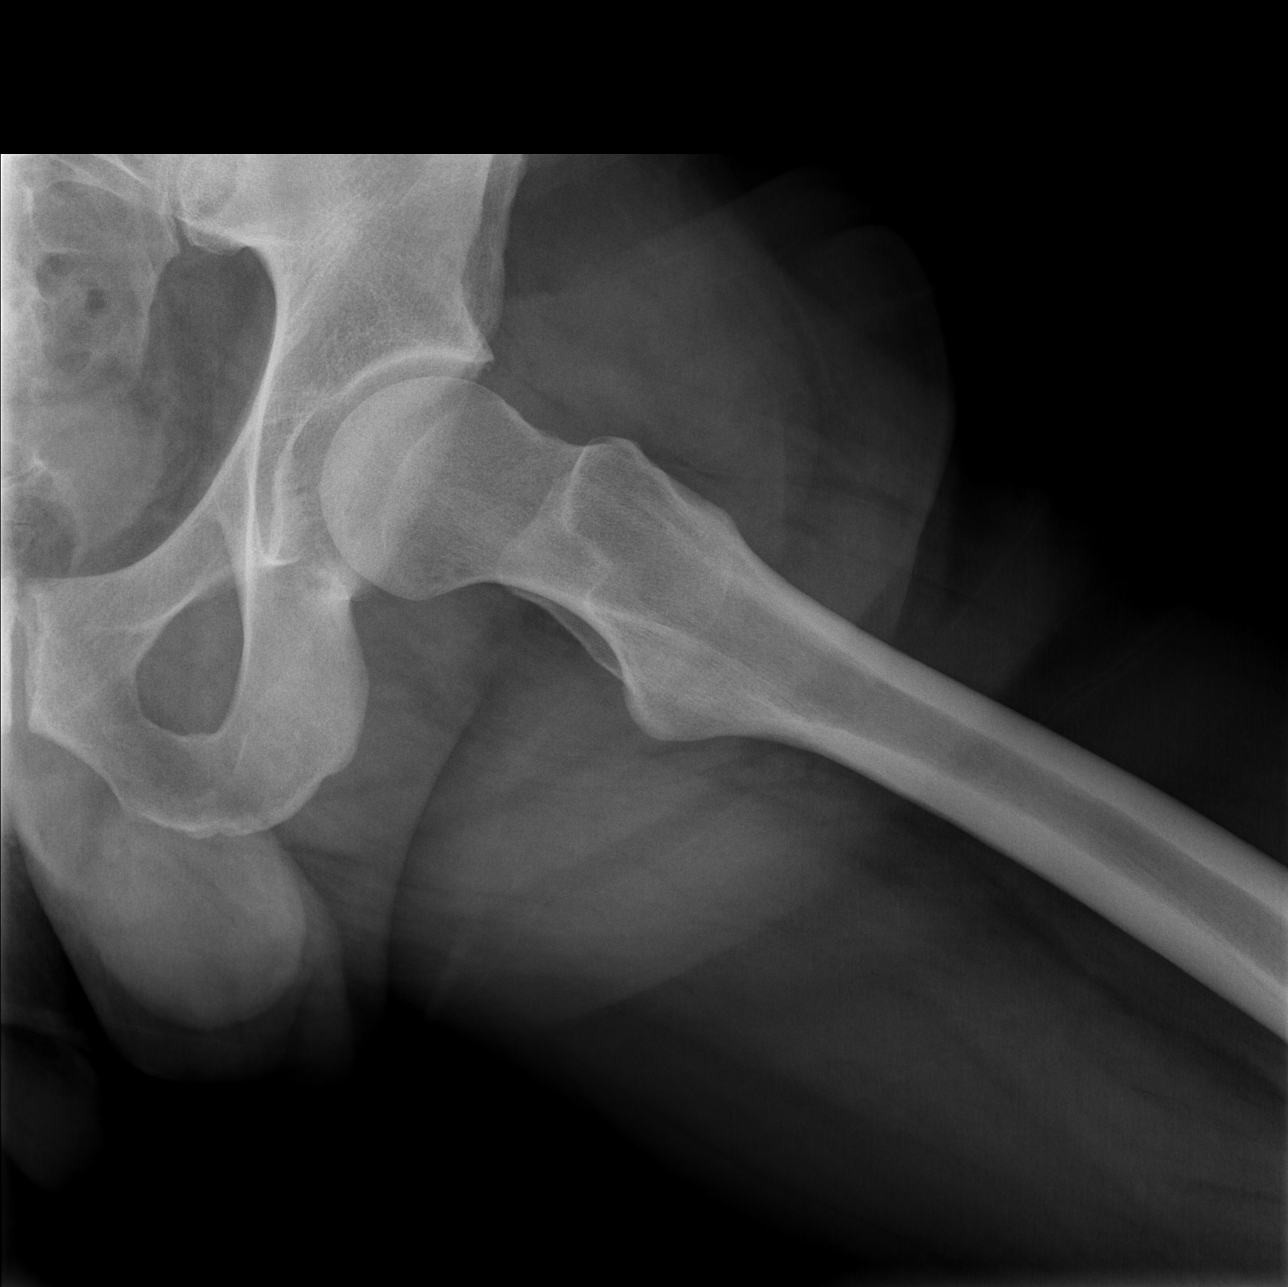

[t hip frog leg right]
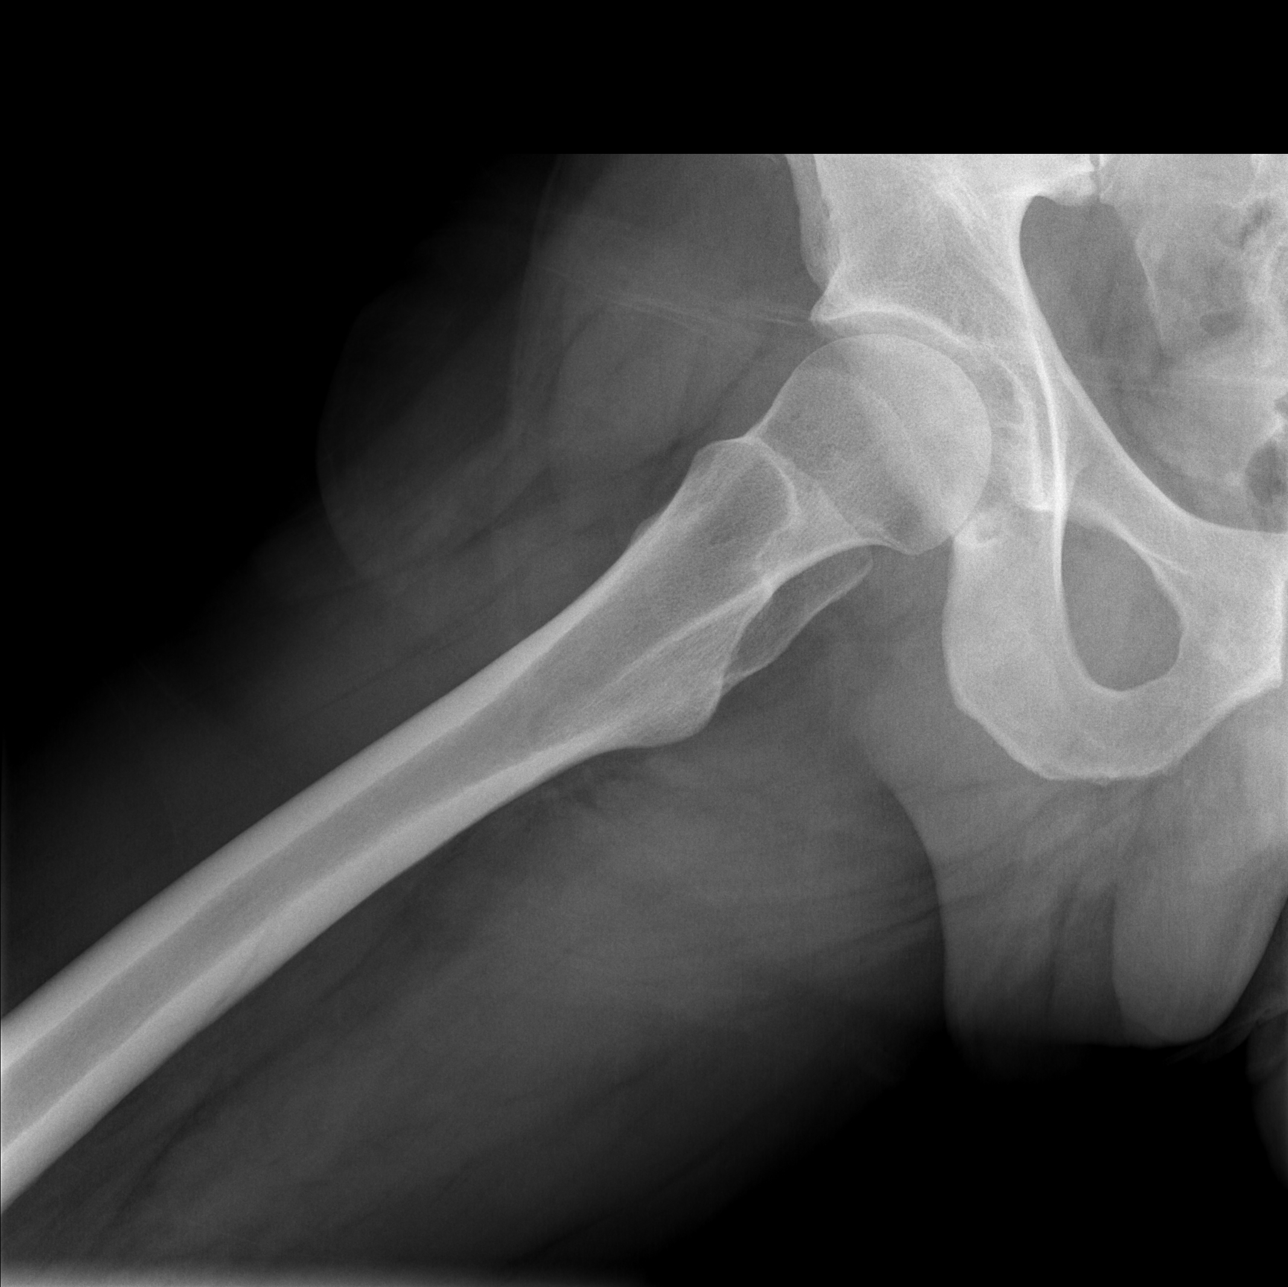

[3 of 3 positions shown; findings below may reference images not displayed]

FINDINGS: There is no evidence of hip fracture or dislocation. There is no
evidence of arthropathy or other focal bone abnormality.
IMPRESSION: Negative.

## 2021-10-30 ENCOUNTER — Ambulatory Visit: Payer: 59 | Admitting: Adult Health

## 2021-12-26 ENCOUNTER — Other Ambulatory Visit: Payer: Self-pay

## 2021-12-26 MED ORDER — ESCITALOPRAM OXALATE 10 MG PO TABS: ORAL_TABLET | ORAL | 0 refills | Status: DC

## 2021-12-26 NOTE — Telephone Encounter (Signed)
20 mg tablet of Lexapro is on back order, change to 10 mg take two (2) tablets daily  ?

## 2022-01-19 ENCOUNTER — Ambulatory Visit: Payer: 59 | Admitting: Adult Health

## 2022-04-03 ENCOUNTER — Other Ambulatory Visit: Payer: Self-pay

## 2022-04-03 ENCOUNTER — Other Ambulatory Visit: Payer: Self-pay | Admitting: Adult Health

## 2022-04-03 DIAGNOSIS — F341 Dysthymic disorder: Secondary | ICD-10-CM

## 2022-04-03 DIAGNOSIS — F411 Generalized anxiety disorder: Secondary | ICD-10-CM

## 2022-04-03 MED ORDER — ESCITALOPRAM OXALATE 20 MG PO TABS: 20.0000 mg | ORAL_TABLET | Freq: Every day | ORAL | 3 refills | Status: DC

## 2022-08-24 ENCOUNTER — Telehealth: Payer: Self-pay | Admitting: Adult Health

## 2022-08-24 DIAGNOSIS — F341 Dysthymic disorder: Secondary | ICD-10-CM

## 2022-08-24 MED ORDER — BUPROPION HCL ER (XL) 150 MG PO TB24: ORAL_TABLET | ORAL | 0 refills | Status: DC

## 2022-08-24 NOTE — Telephone Encounter (Signed)
Sent!

## 2022-08-24 NOTE — Telephone Encounter (Signed)
Pt would like refill of Buproprion to [address]  He will be out before his appt  Next appt 12/21

## 2022-09-03 ENCOUNTER — Encounter: Payer: Self-pay | Admitting: Adult Health

## 2022-09-03 ENCOUNTER — Ambulatory Visit: Payer: 59 | Admitting: Adult Health

## 2022-09-03 DIAGNOSIS — F411 Generalized anxiety disorder: Secondary | ICD-10-CM

## 2022-09-03 DIAGNOSIS — F341 Dysthymic disorder: Secondary | ICD-10-CM | POA: Diagnosis not present

## 2022-09-03 MED ORDER — BUPROPION HCL ER (XL) 150 MG PO TB24: ORAL_TABLET | ORAL | 5 refills | Status: DC

## 2022-09-03 MED ORDER — ESCITALOPRAM OXALATE 20 MG PO TABS: 20.0000 mg | ORAL_TABLET | Freq: Every day | ORAL | 5 refills | Status: DC

## 2022-09-03 NOTE — Progress Notes (Signed)
Ryan Welch [id number] August 06, 1971 51 y.o.  Subjective:   Patient ID:  Ryan Welch is a 51 y.o. (DOB [birth year]) male.  Chief Complaint: No chief complaint on file.   HPI Ryan Welch presents to the office today for follow-up of MDD and GAD.  Describes mood today as "ok". Pleasant. Mood symptoms - denies depression, anxiety, or irritability. Mood is consistent. Stating "I'm doing alright". Feels like the Wellbutrin and Lexapro work well. Family doing well. Improved interest and motivation. Taking medications as prescribed.  Energy levels stable. Active, exercises some days.   Enjoys some usual interests and activities. Married. Lives with wife. 2 children 23 and 18. Spending time with family. Appetite adequate. Weight gain - 218 - 220 pounds.. Sleeps well most nights. Averages 8 hours. Using CPAP machine. Focus and concentration stable. Completing tasks. Managing aspects of household. Works full time for [employer]. Denies SI or HI.  Denies AH or VH.  Previous medication trials: Wellbutrin   Review of Systems:  Review of Systems  Musculoskeletal:  Negative for gait problem.  Neurological:  Negative for tremors.  Psychiatric/Behavioral:         Please refer to HPI    Medications: I have reviewed the patient's current medications.  Current Outpatient Medications  Medication Sig Dispense Refill   buPROPion (WELLBUTRIN XL) 150 MG 24 hr tablet Take three tablets every morning. 90 tablet 5   escitalopram (LEXAPRO) 20 MG tablet Take 1 tablet (20 mg total) by mouth at bedtime. 30 tablet 5   HYDROcodone-acetaminophen (NORCO/VICODIN) 5-325 MG per tablet Take 2 tablets by mouth every 4 (four) hours as needed. (Patient not taking: Reported on 08/02/2018) 12 tablet 0   ibuprofen (ADVIL,MOTRIN) 200 MG tablet Take 600 mg by mouth every 6 (six) hours as needed (For pain).      lovastatin (MEVACOR) 20 MG tablet Take 20 mg by mouth at bedtime.     Omega-3 Fatty Acids  (FISH OIL) 300 MG CAPS Take 300 mg by mouth daily.     omeprazole (PRILOSEC) 40 MG capsule Take 40 mg by mouth daily before breakfast.     ondansetron (ZOFRAN ODT) 4 MG disintegrating tablet Take 1 tablet (4 mg total) by mouth every 8 (eight) hours as needed for nausea or vomiting. (Patient not taking: Reported on 08/02/2018) 10 tablet 0   No current facility-administered medications for this visit.    Medication Side Effects: None  Allergies:  Allergies  Allergen Reactions   Atorvastatin     REACTION: myalgias Other reaction(s): jt pain    Past Medical History:  Diagnosis Date   Allergic rhinitis    Anxiety    GERD (gastroesophageal reflux disease)    Hyperlipidemia    Renal disorder    Sleep apnea     Past Medical History, Surgical history, Social history, and Family history were reviewed and updated as appropriate.   Please see review of systems for further details on the patient's review from today.   Objective:   Physical Exam:  There were no vitals taken for this visit.  Physical Exam Constitutional:      General: He is not in acute distress. Musculoskeletal:        General: No deformity.  Neurological:     Mental Status: He is alert and oriented to person, place, and time.     Coordination: Coordination normal.  Psychiatric:        Attention and Perception: Attention and perception normal. He does not perceive  auditory or visual hallucinations.        Mood and Affect: Mood normal. Mood is not anxious or depressed. Affect is not labile, blunt, angry or inappropriate.        Speech: Speech normal.        Behavior: Behavior normal.        Thought Content: Thought content normal. Thought content is not paranoid or delusional. Thought content does not include homicidal or suicidal ideation. Thought content does not include homicidal or suicidal plan.        Cognition and Memory: Cognition and memory normal.        Judgment: Judgment normal.     Comments: Insight  intact     Lab Review:     Component Value Date/Time   NA 139 01/08/2014 0900   K 4.1 01/08/2014 0900   CL 97 01/08/2014 0900   CO2 26 01/08/2014 0900   GLUCOSE 106 (H) 01/08/2014 0900   BUN 17 01/08/2014 0900   CREATININE 1.13 01/08/2014 0900   CALCIUM 9.7 01/08/2014 0900   PROT 7.6 01/08/2014 0900   ALBUMIN 4.5 01/08/2014 0900   AST 26 01/08/2014 0900   ALT 26 01/08/2014 0900   ALKPHOS 59 01/08/2014 0900   BILITOT 0.4 01/08/2014 0900   GFRNONAA 80 (L) 01/08/2014 0900   GFRAA >90 01/08/2014 0900       Component Value Date/Time   WBC 6.4 01/08/2014 0900   RBC 5.34 01/08/2014 0900   HGB 15.3 01/08/2014 0900   HCT 44.2 01/08/2014 0900   PLT 279 01/08/2014 0900   MCV 82.8 01/08/2014 0900   MCH 28.7 01/08/2014 0900   MCHC 34.6 01/08/2014 0900   RDW 12.5 01/08/2014 0900   LYMPHSABS 2.2 01/08/2014 0900   MONOABS 0.6 01/08/2014 0900   EOSABS 0.1 01/08/2014 0900   BASOSABS 0.0 01/08/2014 0900    No results found for: "POCLITH", "LITHIUM"   No results found for: "PHENYTOIN", "PHENOBARB", "VALPROATE", "CBMZ"   .res Assessment: Plan:    Plan:  PDMP reviewed  1. Lexapro 20mg  daily 2. Wellbutrin XL 450mg  daily - denies seizure history.  RTC 1 year - - will call in 6 months for next set of refills.   Patient advised to contact office with any questions, adverse effects, or acute worsening in signs and symptoms.  Time spent with patient was 15 minutes. Greater than 50% of face to face time with patient was spent on counseling and coordination of care.    Diagnoses and all orders for this visit:  Persistent depressive disorder with melancholic features, currently mild -     buPROPion (WELLBUTRIN XL) 150 MG 24 hr tablet; Take three tablets every morning. -     escitalopram (LEXAPRO) 20 MG tablet; Take 1 tablet (20 mg total) by mouth at bedtime.  Generalized anxiety disorder -     escitalopram (LEXAPRO) 20 MG tablet; Take 1 tablet (20 mg total) by mouth at  bedtime.     Please see After Visit Summary for patient specific instructions.  Future Appointments  Date Time Provider Department Center  09/03/2023  9:00 AM Melisse Caetano, , NP [facility] None    No orders of the defined types were placed in this encounter.   -------------------------------

## 2023-03-25 ENCOUNTER — Other Ambulatory Visit: Payer: Self-pay | Admitting: Adult Health

## 2023-03-25 DIAGNOSIS — F341 Dysthymic disorder: Secondary | ICD-10-CM

## 2023-08-21 ENCOUNTER — Other Ambulatory Visit: Payer: Self-pay | Admitting: Adult Health

## 2023-08-21 DIAGNOSIS — F341 Dysthymic disorder: Secondary | ICD-10-CM

## 2023-09-03 ENCOUNTER — Ambulatory Visit: Payer: 59 | Admitting: Adult Health

## 2023-09-03 ENCOUNTER — Encounter: Payer: Self-pay | Admitting: Adult Health

## 2023-09-03 DIAGNOSIS — F411 Generalized anxiety disorder: Secondary | ICD-10-CM | POA: Diagnosis not present

## 2023-09-03 DIAGNOSIS — F341 Dysthymic disorder: Secondary | ICD-10-CM

## 2023-09-03 NOTE — Progress Notes (Signed)
Ryan Welch [id number] Jul 22, 1971 52 y.o.  Subjective:   Patient ID:  Ryan Welch is a 52 y.o. (DOB October 22, 1970) male.  Chief Complaint: No chief complaint on file.   HPI Arcenio Bhola Midkiff presents to the office today for follow-up of MDD and GAD.  Describes mood today as "ok". Pleasant. Mood symptoms - denies depression and anxiety. Reports some irritability - feels tired. Denies panic attacks. Denies worry, rumination, and over thinking. Mood is variable - nothing extreme. Stating "overall, I don't feel like I'm functioning as well as I was - less drive". Feels like the Wellbutrin and Lexapro work well. Family doing well. Improved interest and motivation. Taking medications as prescribed.  Energy levels stable. Active, exercises some days.   Enjoys some usual interests and activities. Married. Lives with wife. 2 children 23 and 18. Spending time with family. Appetite adequate. Weight gain - 210 to 215 pounds. Sleeps well most nights. Averages 8 hours. Using CPAP machine. Focus and concentration stable. Completing tasks. Managing aspects of household. Works full time for [employer]. Denies SI or HI.  Denies AH or VH. Denies self harm. Denies substance use.   Previous medication trials: Wellbutrin      Review of Systems:  Review of Systems  Musculoskeletal:  Negative for gait problem.  Neurological:  Negative for tremors.  Psychiatric/Behavioral:         Please refer to HPI    Medications: I have reviewed the patient's current medications.  Current Outpatient Medications  Medication Sig Dispense Refill   buPROPion (WELLBUTRIN XL) 150 MG 24 hr tablet TAKE 3 TABLETS BY MOUTH EVERY MORNING 270 tablet 2   escitalopram (LEXAPRO) 20 MG tablet Take 1 tablet (20 mg total) by mouth at bedtime. 30 tablet 5   HYDROcodone-acetaminophen (NORCO/VICODIN) 5-325 MG per tablet Take 2 tablets by mouth every 4 (four) hours as needed. (Patient not taking: Reported on 08/02/2018)  12 tablet 0   ibuprofen (ADVIL,MOTRIN) 200 MG tablet Take 600 mg by mouth every 6 (six) hours as needed (For pain).      lovastatin (MEVACOR) 20 MG tablet Take 20 mg by mouth at bedtime.     Omega-3 Fatty Acids (FISH OIL) 300 MG CAPS Take 300 mg by mouth daily.     omeprazole (PRILOSEC) 40 MG capsule Take 40 mg by mouth daily before breakfast.     ondansetron (ZOFRAN ODT) 4 MG disintegrating tablet Take 1 tablet (4 mg total) by mouth every 8 (eight) hours as needed for nausea or vomiting. (Patient not taking: Reported on 08/02/2018) 10 tablet 0   No current facility-administered medications for this visit.    Medication Side Effects: None  Allergies:  Allergies  Allergen Reactions   Atorvastatin     REACTION: myalgias Other reaction(s): jt pain    Past Medical History:  Diagnosis Date   Allergic rhinitis    Anxiety    GERD (gastroesophageal reflux disease)    Hyperlipidemia    Renal disorder    Sleep apnea     Past Medical History, Surgical history, Social history, and Family history were reviewed and updated as appropriate.   Please see review of systems for further details on the patient's review from today.   Objective:   Physical Exam:  There were no vitals taken for this visit.  Physical Exam Constitutional:      General: He is not in acute distress. Musculoskeletal:        General: No deformity.  Neurological:  Mental Status: He is alert and oriented to person, place, and time.     Coordination: Coordination normal.  Psychiatric:        Attention and Perception: Attention and perception normal. He does not perceive auditory or visual hallucinations.        Mood and Affect: Mood normal. Mood is not anxious or depressed. Affect is not labile, blunt, angry or inappropriate.        Speech: Speech normal.        Behavior: Behavior normal.        Thought Content: Thought content normal. Thought content is not paranoid or delusional. Thought content does not  include homicidal or suicidal ideation. Thought content does not include homicidal or suicidal plan.        Cognition and Memory: Cognition and memory normal.        Judgment: Judgment normal.     Comments: Insight intact     Lab Review:     Component Value Date/Time   NA 139 01/08/2014 0900   K 4.1 01/08/2014 0900   CL 97 01/08/2014 0900   CO2 26 01/08/2014 0900   GLUCOSE 106 (H) 01/08/2014 0900   BUN 17 01/08/2014 0900   CREATININE 1.13 01/08/2014 0900   CALCIUM 9.7 01/08/2014 0900   PROT 7.6 01/08/2014 0900   ALBUMIN 4.5 01/08/2014 0900   AST 26 01/08/2014 0900   ALT 26 01/08/2014 0900   ALKPHOS 59 01/08/2014 0900   BILITOT 0.4 01/08/2014 0900   GFRNONAA 80 (L) 01/08/2014 0900   GFRAA >90 01/08/2014 0900       Component Value Date/Time   WBC 6.4 01/08/2014 0900   RBC 5.34 01/08/2014 0900   HGB 15.3 01/08/2014 0900   HCT 44.2 01/08/2014 0900   PLT 279 01/08/2014 0900   MCV 82.8 01/08/2014 0900   MCH 28.7 01/08/2014 0900   MCHC 34.6 01/08/2014 0900   RDW 12.5 01/08/2014 0900   LYMPHSABS 2.2 01/08/2014 0900   MONOABS 0.6 01/08/2014 0900   EOSABS 0.1 01/08/2014 0900   BASOSABS 0.0 01/08/2014 0900    No results found for: "POCLITH", "LITHIUM"   No results found for: "PHENYTOIN", "PHENOBARB", "VALPROATE", "CBMZ"   .res Assessment: Plan:   Plan:  PDMP reviewed  1. Lexapro 20mg  daily 2. Wellbutrin XL 450mg  daily - denies seizure history.  Will follow up with PCP for low energy.  RTC 1 year - will call in 6 months for next set of refills.   Patient advised to contact office with any questions, adverse effects, or acute worsening in signs and symptoms.  There are no diagnoses linked to this encounter.   Please see After Visit Summary for patient specific instructions.  No future appointments.  No orders of the defined types were placed in this encounter.   -------------------------------

## 2023-10-11 ENCOUNTER — Other Ambulatory Visit: Payer: Self-pay | Admitting: Adult Health

## 2023-10-11 DIAGNOSIS — F411 Generalized anxiety disorder: Secondary | ICD-10-CM

## 2023-10-11 DIAGNOSIS — F341 Dysthymic disorder: Secondary | ICD-10-CM

## 2024-01-07 ENCOUNTER — Other Ambulatory Visit (HOSPITAL_COMMUNITY): Payer: Self-pay | Admitting: Physician Assistant

## 2024-01-07 DIAGNOSIS — Z136 Encounter for screening for cardiovascular disorders: Secondary | ICD-10-CM

## 2024-01-14 ENCOUNTER — Ambulatory Visit (HOSPITAL_COMMUNITY)
Admission: RE | Admit: 2024-01-14 | Discharge: 2024-01-14 | Disposition: A | Discharge status: Home / Self Care | Payer: Self-pay | Source: Ambulatory Visit | Attending: Physician Assistant | Admitting: Physician Assistant

## 2024-01-14 DIAGNOSIS — Z136 Encounter for screening for cardiovascular disorders: Secondary | ICD-10-CM | POA: Insufficient documentation

## 2024-04-03 ENCOUNTER — Other Ambulatory Visit: Payer: Self-pay | Admitting: Adult Health

## 2024-04-03 DIAGNOSIS — F411 Generalized anxiety disorder: Secondary | ICD-10-CM

## 2024-04-03 DIAGNOSIS — F341 Dysthymic disorder: Secondary | ICD-10-CM

## 2024-05-02 ENCOUNTER — Encounter (HOSPITAL_COMMUNITY): Payer: Self-pay | Admitting: *Deleted

## 2024-05-02 ENCOUNTER — Ambulatory Visit (HOSPITAL_COMMUNITY)
Admission: EM | Admit: 2024-05-02 | Discharge: 2024-05-02 | Disposition: A | Discharge status: Home / Self Care | Attending: Family Medicine | Admitting: Family Medicine

## 2024-05-02 DIAGNOSIS — H60501 Unspecified acute noninfective otitis externa, right ear: Secondary | ICD-10-CM

## 2024-05-02 MED ORDER — CIPROFLOXACIN-DEXAMETHASONE 0.3-0.1 % OT SUSP: 4.0000 [drp] | Freq: Two times a day (BID) | OTIC | 0 refills | Status: AC

## 2024-05-02 NOTE — ED Triage Notes (Signed)
 Pt states he has right ear pain X 2 days. He has these sx a couple weeks ago he used old drops he had from a visit it resolved but came back. He hasn't taken any meds recently.

## 2024-05-02 NOTE — Discharge Instructions (Addendum)
 You were seen today for an infection to the ear.  I have sent out ear drops to use for the next 7 days.  You may use motrin/tylenol  for pain.  Please return if your symptoms are not improving.

## 2024-05-02 NOTE — ED Provider Notes (Signed)
 [facility]    CSN: [id number] Arrival date & time: 05/02/24  1300      History   Chief Complaint Chief Complaint  Patient presents with   Otalgia    HPI Ryan Welch is a 53 y.o. male.    Otalgia  Patient is here for right ear pain x 2 days  No runny nose, congestion or drainage.  No drainage from the ear.  Some pain if he pulls on the ear.  About 3 weeks ago he had the same thing.  Had some abx ear drops that he used for only 3 days at that time, and has come back.        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   Persistent depressive disorder with melancholic features, currently mild 01/29/2020   BACK PAIN 05/10/2009   OBSTRUCTIVE SLEEP APNEA 10/26/2008   SOMNOLENCE 10/12/2008   SNORING 10/12/2008   HYPERLIPIDEMIA 01/23/2008   Generalized anxiety disorder 01/23/2008   CARPAL TUNNEL SYNDROME 01/23/2008   ALLERGIC RHINITIS 01/23/2008   GERD 01/23/2008   INSOMNIA 01/23/2008    Past Surgical History:  Procedure Laterality Date   CARPAL TUNNEL RELEASE  10/21/2011   Procedure: CARPAL TUNNEL RELEASE;  Surgeon: Arley JONELLE Curia, MD;  Location: [facility];  Service: Orthopedics;  Laterality: Right;   deviated septum and polyps removed     ESOPHAGOGASTRODUODENOSCOPY  11/2004   gastritis   left hand carpal tunnel surgery     sleep study     2010       Home Medications    Prior to Admission medications   Medication Sig Start Date End Date Taking? Authorizing Provider  buPROPion  (WELLBUTRIN  XL) 150 MG 24 hr tablet TAKE 3 TABLETS BY MOUTH EVERY MORNING 08/22/23  Yes Mozingo, Regina Nattalie, NP  escitalopram  (LEXAPRO ) 20 MG tablet TAKE 1 TABLET BY MOUTH AT BEDTIME 04/03/24  Yes Mozingo, Regina  Nattalie, NP  omeprazole (PRILOSEC) 40 MG capsule Take 40 mg by mouth daily before breakfast. 08/19/19  Yes [provider]  testosterone cypionate (DEPOTESTOSTERONE CYPIONATE) 200 MG/ML injection Inject 200 mg into the muscle every 14 (fourteen) days.   Yes [provider]  HYDROcodone -acetaminophen  (NORCO/VICODIN) 5-325 MG per tablet Take 2 tablets by mouth every 4 (four) hours as needed. Patient not taking: Reported on 08/02/2018 01/08/14   Szekalski, Kaitlyn, PA-C  ibuprofen (ADVIL,MOTRIN) 200 MG tablet Take 600 mg by mouth every 6 (six) hours as needed (For pain).     [provider]  lovastatin (MEVACOR) 20 MG tablet Take 20 mg by mouth at bedtime. 08/05/19   [provider]  Omega-3 Fatty Acids (FISH OIL) 300 MG CAPS Take 300 mg by mouth daily.    [provider]  ondansetron  (ZOFRAN  ODT) 4 MG disintegrating tablet Take 1 tablet (4 mg total) by mouth every 8 (eight) hours as needed for nausea or vomiting. Patient not taking: Reported on 08/02/2018 01/08/14   Szekalski, Kaitlyn, PA-C    Family History Family History  Problem Relation Age of Onset   GER disease Father    Coronary artery disease Father     Social History Social History   Tobacco Use   Smoking  status: Never   Smokeless tobacco: Never  Vaping Use   Vaping status: Never Used  Substance Use Topics   Alcohol use: No   Drug use: No     Allergies   Atorvastatin and Ezetimibe   Review of Systems Review of Systems  Constitutional: Negative.   HENT:  Positive for ear pain.   Respiratory: Negative.    Cardiovascular: Negative.   Gastrointestinal: Negative.   Musculoskeletal: Negative.   Psychiatric/Behavioral: Negative.       Physical Exam Triage Vital Signs ED Triage Vitals  Encounter Vitals Group     BP 05/02/24 1318 128/82     Girls Systolic BP Percentile --      Girls Diastolic BP Percentile --      Boys Systolic BP Percentile --      Boys Diastolic BP  Percentile --      Pulse Rate 05/02/24 1318 72     Resp 05/02/24 1318 18     Temp 05/02/24 1318 97.7 F (36.5 C)     Temp Source 05/02/24 1318 Oral     SpO2 05/02/24 1318 96 %     Weight --      Height --      Head Circumference --      Peak Flow --      Pain Score 05/02/24 1317 3     Pain Loc --      Pain Education --      Exclude from Growth Chart --    No data found.  Updated Vital Signs BP 128/82 (BP Location: Left Arm)   Pulse 72   Temp 97.7 F (36.5 C) (Oral)   Resp 18   SpO2 96%   Visual Acuity Right Eye Distance:   Left Eye Distance:   Bilateral Distance:    Right Eye Near:   Left Eye Near:    Bilateral Near:     Physical Exam Constitutional:      General: He is not in acute distress.    Appearance: Normal appearance. He is normal weight. He is not ill-appearing or toxic-appearing.  HENT:     Right Ear: Tympanic membrane normal. Drainage and tenderness present.     Left Ear: Tympanic membrane and ear canal normal.  Cardiovascular:     Rate and Rhythm: Normal rate and regular rhythm.  Pulmonary:     Effort: Pulmonary effort is normal.     Breath sounds: Normal breath sounds.  Musculoskeletal:     Cervical back: Normal range of motion and neck supple. No tenderness.  Lymphadenopathy:     Cervical: No cervical adenopathy.  Neurological:     General: No focal deficit present.     Mental Status: He is alert.  Psychiatric:        Mood and Affect: Mood normal.      UC Treatments / Results  Labs (all labs ordered are listed, but only abnormal results are displayed) Labs Reviewed - No data to display  EKG   Radiology No results found.  Procedures Procedures (including critical care time)  Medications Ordered in UC Medications - No data to display  Initial Impression / Assessment and Plan / UC Course  I have reviewed the triage vital signs and the nursing notes.  Pertinent labs & imaging results that were available during my care of the  patient were reviewed by me and considered in my medical decision making (see chart for details).   Final Clinical Impressions(s) / UC Diagnoses  Final diagnoses:  Acute otitis externa of right ear, unspecified type     Discharge Instructions      You were seen today for an infection to the ear.  I have sent out ear drops to use for the next 7 days.  You may use motrin/tylenol  for pain.  Please return if your symptoms are not improving.     ED Prescriptions     Medication Sig Dispense Auth. Provider   ciprofloxacin -dexamethasone  (CIPRODEX ) OTIC suspension Place 4 drops into the right ear 2 (two) times daily for 7 days. 7.5 mL Darral Longs, MD      PDMP not reviewed this encounter.   Darral Longs, MD 05/02/24 1330

## 2024-06-13 ENCOUNTER — Other Ambulatory Visit: Payer: Self-pay | Admitting: Adult Health

## 2024-06-13 DIAGNOSIS — F341 Dysthymic disorder: Secondary | ICD-10-CM

## 2024-09-19 ENCOUNTER — Encounter: Payer: Self-pay | Admitting: Adult Health

## 2024-09-19 ENCOUNTER — Ambulatory Visit (INDEPENDENT_AMBULATORY_CARE_PROVIDER_SITE_OTHER): Payer: 59 | Admitting: Adult Health

## 2024-09-19 DIAGNOSIS — F341 Dysthymic disorder: Secondary | ICD-10-CM | POA: Diagnosis not present

## 2024-09-19 DIAGNOSIS — F411 Generalized anxiety disorder: Secondary | ICD-10-CM

## 2024-09-19 MED ORDER — ESCITALOPRAM OXALATE 20 MG PO TABS: 20.0000 mg | ORAL_TABLET | Freq: Every day | ORAL | 5 refills | Status: AC

## 2024-09-19 MED ORDER — BUPROPION HCL ER (XL) 150 MG PO TB24: 450.0000 mg | ORAL_TABLET | Freq: Every morning | ORAL | 5 refills | Status: DC

## 2024-09-19 NOTE — Progress Notes (Signed)
 Ryan Welch [id number] 31-Jan-1971 54 y.o.  Virtual Visit via Telephone Note  I connected with pt on 09/19/2024 at  9:00 AM EST by telephone and verified that I am speaking with the correct person using two identifiers.   I discussed the limitations, risks, security and privacy concerns of performing an evaluation and management service by telephone and the availability of in person appointments. I also discussed with the patient that there may be a patient responsible charge related to this service. The patient expressed understanding and agreed to proceed.   I discussed the assessment and treatment plan with the patient. The patient was provided an opportunity to ask questions and all were answered. The patient agreed with the plan and demonstrated an understanding of the instructions.   The patient was advised to call back or seek an in-person evaluation if the symptoms worsen or if the condition fails to improve as anticipated.  I provided 15 minutes of non-face-to-face time during this encounter.  The patient was located at home.  The provider was located at [facility].   Angeline LOISE Sayers, NP   Subjective:   Patient ID:  Ryan Welch is a 54 y.o. (DOB [birth year]) male.  Chief Complaint: No chief complaint on file.   HPI Ryan Welch presents for follow-up of MDD and GAD.  Describes mood today as ok. Pleasant. Mood symptoms - reports some depression, irritability and anxiety - here and there, not anything that's bothering me. Reports stable interest and motivation. Denies panic attacks. Denies worry and rumination. Reports some over thinking. Mood is variable - nothing extreme. Stating I feel like I'm doing ok. Feels like the Wellbutrin  and Lexapro  work well. Taking medications as prescribed.  Energy levels better - started on Testosterone. Active, exercises some days.   Enjoys some usual interests and activities. Married. Lives with wife - 2 children 78  and 79. Spending time with family. Appetite adequate. Weight loss - 234 to 222 pounds. Sleeps well most nights. Averages 8 hours. Using CPAP machine. Focus and concentration stable. Completing tasks. Managing aspects of household. Works full time for [employer]. Denies SI or HI.  Denies AH or VH. Denies self harm. Denies substance use.   Previous medication trials: Wellbutrin   Review of Systems:  Review of Systems  Musculoskeletal:  Negative for gait problem.  Neurological:  Negative for tremors.  Psychiatric/Behavioral:         Please refer to HPI    Medications: I have reviewed the patient's current medications.  Current Outpatient Medications  Medication Sig Dispense Refill   buPROPion  (WELLBUTRIN  XL) 150 MG 24 hr tablet Take 3 tablets (450 mg total) by mouth every morning. 30 tablet 5   escitalopram  (LEXAPRO ) 20 MG tablet Take 1 tablet (20 mg total) by mouth at bedtime. 30 tablet 5   HYDROcodone -acetaminophen  (NORCO/VICODIN) 5-325 MG per tablet Take 2 tablets by mouth every 4 (four) hours as needed. (Patient not taking: Reported on 08/02/2018) 12 tablet 0   ibuprofen (ADVIL,MOTRIN) 200 MG tablet Take 600 mg by mouth every 6 (six) hours as needed (For pain).      lovastatin (MEVACOR) 20 MG tablet Take 20 mg by mouth at bedtime.     Omega-3 Fatty Acids (FISH OIL) 300 MG CAPS Take 300 mg by mouth daily.     omeprazole (PRILOSEC) 40 MG capsule Take 40 mg by mouth daily before breakfast.     ondansetron  (ZOFRAN  ODT) 4 MG disintegrating tablet Take 1 tablet (4  mg total) by mouth every 8 (eight) hours as needed for nausea or vomiting. (Patient not taking: Reported on 08/02/2018) 10 tablet 0   testosterone cypionate (DEPOTESTOSTERONE CYPIONATE) 200 MG/ML injection Inject 200 mg into the muscle every 14 (fourteen) days.     No current facility-administered medications for this visit.    Medication Side Effects: None  Allergies: Allergies[1]  Past Medical History:   Diagnosis Date   Allergic rhinitis    Anxiety    GERD (gastroesophageal reflux disease)    Hyperlipidemia    Renal disorder    Sleep apnea     Family History  Problem Relation Age of Onset   GER disease Father    Coronary artery disease Father     Social History   Socioeconomic History   Marital status: Married    Spouse name: Not on file   Number of children: 1   Years of education: Not on file   Highest education level: Not on file  Occupational History   Occupation: Psychologist, [employer]: UNEMPLOYED  Tobacco Use   Smoking status: Never   Smokeless tobacco: Never  Vaping Use   Vaping status: Never Used  Substance and Sexual Activity   Alcohol use: No   Drug use: No   Sexual activity: Yes  Other Topics Concern   Not on file  Social History Narrative   Not on file   Social Drivers of Health   Tobacco Use: Low Risk (09/19/2024)   Patient History    Smoking Tobacco Use: Never    Smokeless Tobacco Use: Never    Passive Exposure: Not on file  Financial Resource Strain: Not on file  Food Insecurity: Not on file  Transportation Needs: Not on file  Physical Activity: Not on file  Stress: Not on file  Social Connections: Not on file  Intimate Partner Violence: Not on file  Depression (EYV7-0): Not on file  Alcohol Screen: Not on file  Housing: Not on file  Utilities: Not on file  Health Literacy: Not on file    Past Medical History, Surgical history, Social history, and Family history were reviewed and updated as appropriate.   Please see review of systems for further details on the patient's review from today.   Objective:   Physical Exam:  There were no vitals taken for this visit.  Physical Exam  Lab Review:     Component Value Date/Time   NA 139 01/08/2014 0900   K 4.1 01/08/2014 0900   CL 97 01/08/2014 0900   CO2 26 01/08/2014 0900   GLUCOSE 106 (H) 01/08/2014 0900   BUN 17 01/08/2014 0900   CREATININE 1.13 01/08/2014 0900    CALCIUM 9.7 01/08/2014 0900   PROT 7.6 01/08/2014 0900   ALBUMIN 4.5 01/08/2014 0900   AST 26 01/08/2014 0900   ALT 26 01/08/2014 0900   ALKPHOS 59 01/08/2014 0900   BILITOT 0.4 01/08/2014 0900   GFRNONAA 80 (L) 01/08/2014 0900   GFRAA >90 01/08/2014 0900       Component Value Date/Time   WBC 6.4 01/08/2014 0900   RBC 5.34 01/08/2014 0900   HGB 15.3 01/08/2014 0900   HCT 44.2 01/08/2014 0900   PLT 279 01/08/2014 0900   MCV 82.8 01/08/2014 0900   MCH 28.7 01/08/2014 0900   MCHC 34.6 01/08/2014 0900   RDW 12.5 01/08/2014 0900   LYMPHSABS 2.2 01/08/2014 0900   MONOABS 0.6 01/08/2014 0900   EOSABS 0.1 01/08/2014 0900   BASOSABS 0.0  01/08/2014 0900    No results found for: POCLITH, LITHIUM   No results found for: PHENYTOIN, PHENOBARB, VALPROATE, CBMZ   .res Assessment: Plan:   Plan:  PDMP reviewed  1. Lexapro  20mg  daily 2. Wellbutrin  XL 450mg  daily - denies seizure history.  Will call in 6 months for next set of refills  RTC 1 year   15 minutes spent dedicated to the care of this patient on the date of this encounter to include pre-visit review of records, ordering of medication, post visit documentation, and face-to-face time with the patient discussing depression and anxiety. Discussed continuing current medication regimen.  Patient advised to contact office with any questions, adverse effects, or acute worsening in signs and symptoms.  Diagnoses and all orders for this visit:  Persistent depressive disorder with melancholic features, currently mild -     buPROPion  (WELLBUTRIN  XL) 150 MG 24 hr tablet; Take 3 tablets (450 mg total) by mouth every morning. -     escitalopram  (LEXAPRO ) 20 MG tablet; Take 1 tablet (20 mg total) by mouth at bedtime.  Generalized anxiety disorder -     escitalopram  (LEXAPRO ) 20 MG tablet; Take 1 tablet (20 mg total) by mouth at bedtime.    Please see After Visit Summary for patient specific instructions.  No future  appointments.  No orders of the defined types were placed in this encounter.     -------------------------------     [1]  Allergies Allergen Reactions   Atorvastatin     REACTION: myalgias Other reaction(s): jt pain   Ezetimibe     Other Reaction(s): muscle pains

## 2024-10-15 ENCOUNTER — Other Ambulatory Visit: Payer: Self-pay | Admitting: Adult Health

## 2024-10-15 DIAGNOSIS — F341 Dysthymic disorder: Secondary | ICD-10-CM
